# Patient Record
Sex: Male | Born: 1950 | Race: White | Hispanic: No | Marital: Married | Smoking: Current every day smoker
Health system: Southern US, Community
[De-identification: ages and names within clinical notes are randomized; demographics above are authoritative.]

## PROBLEM LIST (undated history)

## (undated) DIAGNOSIS — R001 Bradycardia, unspecified: Secondary | ICD-10-CM

## (undated) DIAGNOSIS — E785 Hyperlipidemia, unspecified: Secondary | ICD-10-CM

## (undated) DIAGNOSIS — Z720 Tobacco use: Secondary | ICD-10-CM

## (undated) DIAGNOSIS — T7819XA Other adverse food reactions, not elsewhere classified, initial encounter: Secondary | ICD-10-CM

## (undated) DIAGNOSIS — C801 Malignant (primary) neoplasm, unspecified: Secondary | ICD-10-CM

## (undated) DIAGNOSIS — R011 Cardiac murmur, unspecified: Secondary | ICD-10-CM

## (undated) DIAGNOSIS — I2510 Atherosclerotic heart disease of native coronary artery without angina pectoris: Secondary | ICD-10-CM

## (undated) DIAGNOSIS — I219 Acute myocardial infarction, unspecified: Secondary | ICD-10-CM

## (undated) DIAGNOSIS — T781XXA Other adverse food reactions, not elsewhere classified, initial encounter: Secondary | ICD-10-CM

## (undated) HISTORY — PX: APPENDECTOMY: SHX54

## (undated) HISTORY — PX: CORONARY STENT PLACEMENT: SHX1402

## (undated) HISTORY — DX: Atherosclerotic heart disease of native coronary artery without angina pectoris: I25.10

## (undated) HISTORY — DX: Hyperlipidemia, unspecified: E78.5

## (undated) HISTORY — DX: Tobacco use: Z72.0

---

## 2011-08-31 DIAGNOSIS — I2510 Atherosclerotic heart disease of native coronary artery without angina pectoris: Secondary | ICD-10-CM | POA: Insufficient documentation

## 2011-08-31 HISTORY — DX: Atherosclerotic heart disease of native coronary artery without angina pectoris: I25.10

## 2011-09-11 ENCOUNTER — Inpatient Hospital Stay (HOSPITAL_COMMUNITY)
Admission: AD | Admit: 2011-09-11 | Discharge: 2011-09-13 | DRG: 853 | Disposition: A | Discharge status: Home / Self Care | Payer: BC Managed Care – PPO | Source: Other Acute Inpatient Hospital | Attending: Internal Medicine | Admitting: Internal Medicine

## 2011-09-11 ENCOUNTER — Encounter (HOSPITAL_COMMUNITY): Payer: Self-pay | Admitting: Physician Assistant

## 2011-09-11 ENCOUNTER — Encounter (HOSPITAL_COMMUNITY)
Admission: AD | Disposition: A | Discharge status: Home / Self Care | Payer: Self-pay | Source: Other Acute Inpatient Hospital | Attending: Internal Medicine

## 2011-09-11 DIAGNOSIS — Z9089 Acquired absence of other organs: Secondary | ICD-10-CM

## 2011-09-11 DIAGNOSIS — F17200 Nicotine dependence, unspecified, uncomplicated: Secondary | ICD-10-CM | POA: Diagnosis present

## 2011-09-11 DIAGNOSIS — E781 Pure hyperglyceridemia: Secondary | ICD-10-CM

## 2011-09-11 DIAGNOSIS — I2510 Atherosclerotic heart disease of native coronary artery without angina pectoris: Secondary | ICD-10-CM

## 2011-09-11 DIAGNOSIS — Z880 Allergy status to penicillin: Secondary | ICD-10-CM

## 2011-09-11 DIAGNOSIS — I214 Non-ST elevation (NSTEMI) myocardial infarction: Secondary | ICD-10-CM

## 2011-09-11 DIAGNOSIS — I498 Other specified cardiac arrhythmias: Secondary | ICD-10-CM | POA: Diagnosis present

## 2011-09-11 DIAGNOSIS — Z8249 Family history of ischemic heart disease and other diseases of the circulatory system: Secondary | ICD-10-CM

## 2011-09-11 DIAGNOSIS — I2589 Other forms of chronic ischemic heart disease: Secondary | ICD-10-CM | POA: Diagnosis present

## 2011-09-11 DIAGNOSIS — Z881 Allergy status to other antibiotic agents status: Secondary | ICD-10-CM

## 2011-09-11 DIAGNOSIS — E785 Hyperlipidemia, unspecified: Secondary | ICD-10-CM

## 2011-09-11 HISTORY — DX: Bradycardia, unspecified: R00.1

## 2011-09-11 HISTORY — PX: LEFT HEART CATHETERIZATION WITH CORONARY ANGIOGRAM: SHX5451

## 2011-09-11 HISTORY — PX: PERCUTANEOUS CORONARY STENT INTERVENTION (PCI-S): SHX5485

## 2011-09-11 HISTORY — DX: Cardiac murmur, unspecified: R01.1

## 2011-09-11 LAB — PROTIME-INR: INR: 1.76 — ABNORMAL HIGH (ref 0.00–1.49)

## 2011-09-11 LAB — HEPATIC FUNCTION PANEL
ALT: 16 U/L (ref 0–53)
AST: 42 U/L — ABNORMAL HIGH (ref 0–37)
Albumin: 3.7 g/dL (ref 3.5–5.2)
Total Protein: 6.7 g/dL (ref 6.0–8.3)

## 2011-09-11 LAB — CARDIAC PANEL(CRET KIN+CKTOT+MB+TROPI)
Relative Index: 9.8 — ABNORMAL HIGH (ref 0.0–2.5)
Troponin I: 8.54 ng/mL (ref <0.30)

## 2011-09-11 SURGERY — PERCUTANEOUS CORONARY STENT INTERVENTION (PCI-S)
Laterality: Right

## 2011-09-11 MED ORDER — ACETAMINOPHEN 325 MG PO TABS: 650.0000 mg | ORAL_TABLET | ORAL | Status: DC | PRN

## 2011-09-11 MED ORDER — MORPHINE SULFATE 2 MG/ML IJ SOLN: 2.0000 mg | INTRAMUSCULAR | Status: DC | PRN

## 2011-09-11 MED ORDER — SODIUM CHLORIDE 0.9 % IJ SOLN: 3.0000 mL | INTRAMUSCULAR | Status: DC | PRN

## 2011-09-11 MED ORDER — MIDAZOLAM HCL 2 MG/2ML IJ SOLN
INTRAMUSCULAR | Status: AC
  Filled 2011-09-11: qty 2

## 2011-09-11 MED ORDER — NITROGLYCERIN 0.4 MG SL SUBL: 0.4000 mg | SUBLINGUAL_TABLET | SUBLINGUAL | Status: DC | PRN

## 2011-09-11 MED ORDER — ONDANSETRON HCL 4 MG/2ML IJ SOLN: 4.0000 mg | Freq: Four times a day (QID) | INTRAMUSCULAR | Status: DC | PRN

## 2011-09-11 MED ORDER — SODIUM CHLORIDE 0.9 % IJ SOLN: 3.0000 mL | Freq: Two times a day (BID) | INTRAMUSCULAR | Status: DC

## 2011-09-11 MED ORDER — ATROPINE SULFATE 1 MG/ML IJ SOLN
INTRAMUSCULAR | Status: AC
  Filled 2011-09-11: qty 1

## 2011-09-11 MED ORDER — BIVALIRUDIN 250 MG IV SOLR
INTRAVENOUS | Status: AC
  Filled 2011-09-11: qty 250

## 2011-09-11 MED ORDER — SODIUM CHLORIDE 0.9 % IV SOLN
1.0000 mL/kg/h | INTRAVENOUS | Status: AC
  Administered 2011-09-11: 1 mL/kg/h via INTRAVENOUS

## 2011-09-11 MED ORDER — SODIUM CHLORIDE 0.9 % IV SOLN: 250.0000 mL | INTRAVENOUS | Status: DC

## 2011-09-11 MED ORDER — SODIUM CHLORIDE 0.9 % IV SOLN: INTRAVENOUS | Status: DC

## 2011-09-11 MED ORDER — HEPARIN (PORCINE) IN NACL 2-0.9 UNIT/ML-% IJ SOLN
INTRAMUSCULAR | Status: AC
  Filled 2011-09-11: qty 2000

## 2011-09-11 MED ORDER — HEPARIN SODIUM (PORCINE) 1000 UNIT/ML IJ SOLN
INTRAMUSCULAR | Status: AC
  Filled 2011-09-11: qty 1

## 2011-09-11 MED ORDER — FENTANYL CITRATE 0.05 MG/ML IJ SOLN
INTRAMUSCULAR | Status: AC
  Filled 2011-09-11: qty 2

## 2011-09-11 MED ORDER — HEPARIN BOLUS VIA INFUSION
4000.0000 [IU] | Freq: Once | INTRAVENOUS | Status: AC
  Administered 2011-09-11: 4000 [IU] via INTRAVENOUS
  Filled 2011-09-11: qty 4000

## 2011-09-11 MED ORDER — ATORVASTATIN CALCIUM 80 MG PO TABS
80.0000 mg | ORAL_TABLET | Freq: Every day | ORAL | Status: DC
  Filled 2011-09-11 (×3): qty 1

## 2011-09-11 MED ORDER — ASPIRIN EC 81 MG PO TBEC
81.0000 mg | DELAYED_RELEASE_TABLET | Freq: Every day | ORAL | Status: DC
  Administered 2011-09-12 – 2011-09-13 (×2): 81 mg via ORAL
  Filled 2011-09-11 (×2): qty 1

## 2011-09-11 MED ORDER — PRASUGREL HCL 10 MG PO TABS
10.0000 mg | ORAL_TABLET | Freq: Every day | ORAL | Status: DC
  Administered 2011-09-12 – 2011-09-13 (×2): 10 mg via ORAL
  Filled 2011-09-11 (×2): qty 1

## 2011-09-11 MED ORDER — VERAPAMIL HCL 2.5 MG/ML IV SOLN
INTRAVENOUS | Status: AC
  Filled 2011-09-11: qty 2

## 2011-09-11 MED ORDER — HEPARIN (PORCINE) IN NACL 100-0.45 UNIT/ML-% IJ SOLN
1300.0000 [IU]/h | INTRAMUSCULAR | Status: DC
  Administered 2011-09-11: 1300 [IU]/h via INTRAVENOUS
  Filled 2011-09-11: qty 250

## 2011-09-11 MED ORDER — NITROGLYCERIN 0.2 MG/ML ON CALL CATH LAB
INTRAVENOUS | Status: AC
  Filled 2011-09-11: qty 1

## 2011-09-11 MED ORDER — OXYCODONE-ACETAMINOPHEN 5-325 MG PO TABS
1.0000 | ORAL_TABLET | ORAL | Status: DC | PRN
  Administered 2011-09-12: 1 via ORAL
  Filled 2011-09-11: qty 1

## 2011-09-11 MED ORDER — LIDOCAINE HCL (PF) 1 % IJ SOLN
INTRAMUSCULAR | Status: AC
  Filled 2011-09-11: qty 30

## 2011-09-11 NOTE — H&P (Signed)
History and Physical  Patient ID: Sean Gomez MRN: [id number], DOB: January 07, 1951 Date of Encounter: 09/11/2011, 7:15 PM Primary Physician: Mariella Saa., MD Primary Cardiologist: New to [place] (lives in [place], went to [place])  Chief Complaint: chest pain (with positive enzymes)  HPI: 61 y/o M with self-reported hx of borderline low blood pressures/HR, prior heart murmur but no hx of CAD presented to [facility] with complaints of chest pain. For the past 2 days he has noted intermittent substernal chest discomfort that comes and goes spontaneously without any aggravating factor. Initially he attributed it to eating food but in retrospect he is not so sure. He felt as though if he could only belch, it would feel better. Last night it kept him from sleep but then eased off. This morning it recurred around 9am and became progressively worse associated with some nausea. He tired Gas X and Gaviscon with no relief. In the ER at [facility] he received 2 SL NTG - the first "knocked it out almost completely;" the 2nd NTG made him pain free. Troponin returned elevated at 0.16 (ref range <0.03). He was also given 4 baby ASA. The pain recurred upon arriving to [facility] and he received a SL NTG by [facility] but still has low-grade 1-2/10 chest discomfort. Blood pressures are borderline in the 90s-100s here but the patient states this is chronic for him. He is bradycardic in the 40's. He denies any recent dizziness/palpitations/syncope but does state he has had occasions in the past where he became transiently woozy for a brief moment, but has never passed out. This has not happened recently. EKG at [facility] showed SB with ICRBB, LAFB with biphasic T waves avL, V2. Here at [facility], EKG shows evolving TW changes in III, somewhat acute T waves in V2.  Past Medical History  Diagnosis Date  . Low blood pressure   . Bradycardia   . Heart murmur   . High cholesterol      Most Recent Cardiac  Studies: None   Surgical History:  Past Surgical History  Procedure Date  . Appendectomy      Home Meds: Prior to Admission medications   Not on File    Allergies:  Allergies  Allergen Reactions  . Penicillins     History   Social History  . Marital Status: Single    Spouse Name: N/A    Number of Children: N/A  . Years of Education: N/A   Occupational History  . consultant [employer]   Social History Main Topics  . Smoking status: Current Everyday Smoker  . Smokeless tobacco: Not on file   Comment: Stopped then restarted, total 46 years - currently 2 ppd  . Alcohol Use: No  . Drug Use: No  . Sexually Active: Not on file   Other Topics Concern  . Not on file   Social History Narrative  . No narrative on file     Family History  Problem Relation Age of Onset  . Aortic aneurysm Mother   . Stroke Father   . Coronary artery disease Neg Hx     Review of Systems: General: negative for chills, fever, night sweats. He has lost 25 lbs over 2 years but states he is has been eating less than usual since he retired (but recently went back to work) Cardiovascular: see above Dermatological: negative for rash Respiratory: negative for cough or wheezing Urologic: negative for hematuria Abdominal: negative for nausea, vomiting, diarrhea, melena, or hematemesis. He has had  a remote hx of spotty blood on TP but no recent GI abnormalities; this has never been sustained. Neurologic: negative for visual changes, syncope. Rare occ "woozy" episode, no LOC All other systems reviewed and are otherwise negative except as noted above.  Labs: from [facility] 09/11/11 1. BMET: Na 139, K 3.8, Cl 106, CO2 25, BUN 18, Cr 0.88, Glu 121 2. WBC 8, Hgb 14.6, Hct 42.5, Plt 199 3. 13:37: CK 90, MB 3.7, trop 0.16 (ref range <0.03) 4. D-dimer neg: 0.26 ( <0.49 WNL) 5. BNP WNL at 63  Radiology/Studies:  CXR 09/11/11 - Normal heart size, no acute abnormalities   EKG: 09/11/11 - sinus  bradycardia 44bpm - ICRBB, LAFB. RSR V1. Biphasic TWI avL, V2, poor R wave progression Here at [facility], EKG shows sinus bradycardia evolving TW changes in III, somewhat acute T waves in V2.  Physical Exam: BP 108/70, RR 16, POX 99% 2L, HR 50 General: Well developed pale WM in no acute distress Head: Normocephalic, atraumatic, sclera non-icteric, no xanthomas, nares are without discharge.  Neck: Negative for carotid bruits. JVD not elevated. Lungs: Clear bilaterally to auscultation without wheezes, rales, or rhonchi. Breathing is unlabored. Heart: Markedly bradycardic with S1 S2. No murmurs, rubs, or gallops appreciated. Abdomen: Soft, non-tender, non-distended with normoactive bowel sounds. No hepatomegaly. No rebound/guarding. No obvious abdominal masses. Msk:  Strength and tone appear normal for age. Extremities: No clubbing or cyanosis. No edema.  Distal pedal pulses are 1+ and equal bilaterally. Neuro: Alert and oriented X 3. Moves all extremities spontaneously. Psych:  Responds to questions appropriately with a normal affect.    ASSESSMENT AND PLAN:   1. NSTEMI 2. Bradycardia 3. Tobacco abuse 4. H/o high cholesterol/hypertriglyceridemia 5. Elevated CBG  Mr. Budney's EKG, bradycardia and ongoing chest pain are very worrisome for evolving MI. We have activated the cath lab team and made Dr. Excell Seltzer aware - we feel he requires urgent catheterization. In the meantime, will start IV heparin. BP are soft so avoid IV NTG, BB for now. Check lipid panel, add statin. Check A1C. Further recommendations will be based on cath lab findings.   Signed, Ronie Spies PA-C 09/11/2011, 7:15 PM  Agree with assessment and plan as noted above.  Patient has had stuttering pattern of left sided chest discomfort for past two days, worse today, partially relieved by SL NTG. Has also noted aching discomfort in left arm. Troponin at [facility] slightly elevated 0.16 (nl <0.03).  Patient is bradycardic in the 40s  now, and BP is low.  EKG done on arrival here shows new T wave inversion in III.  Exam reveals mild pallor, not diaphoretic. Mildly anxious. Chest clear to P and A.  Heart tones are soft without murmur gallop or rub. Impression: NSTEMI with ongoing chest discomfort, bradycardia, hypotension.  Suspect possible significant RCA lesion.  Risks and benefits of urgent cath discussed with patient who is agreeable to proceeding to cath lab tonight.

## 2011-09-11 NOTE — Progress Notes (Signed)
Chaplain responded to a Code STEMI. Chaplain not needed at this time. Cath lab team will page chaplain if needed.

## 2011-09-11 NOTE — CV Procedure (Signed)
Cardiac Catheterization Procedure Note  Name: Sean Gomez MRN: [id number] DOB: 1950-10-06  Procedure: Left Heart Cath, Selective Coronary Angiography, LV angiography,  PTCA/Stent of the RCA, Angioseal of the RFA  Indication: 61 year old gentleman who presented with non-STEMI, ongoing chest pain, and hypotension and bradycardia. He was brought urgently for cardiac catheterization.   Diagnostic Procedure Details: The right wrist was prepped draped and anesthetized with 1% lidocaine. Using the modified Seldinger technique, a 6 French sheath was placed in the right radial artery. 3000 units of unfractionated heparin was administered. 3 mg of verapamil as a minister through the sheath. The wire passed easily but the catheter would not pass easily beyond the elbow. The wire and catheter were moved and an angiogram through the radial artery sheath was performed. This demonstrated a steep radial loop. I attempted to wire the loop with a cougar wire but it would not pass into the radial artery. At that point, I felt it was safest to abort the radial approach. The sheath was flushed and attention was turned to the right groin. The groin was prepped, draped, and anesthetized with 1% lidocaine. Using the modified Seldinger technique, a 6 French sheath was introduced into the right femoral artery. Standard Judkins catheters were used for selective coronary angiography and left ventriculography. Catheter exchanges were performed over a wire.  The diagnostic procedure was well-tolerated without immediate complications.  PROCEDURAL FINDINGS Hemodynamics: AO 100/60 LV 99/22  Coronary angiography: Coronary dominance: right  Left mainstem: The left main is very long. There are minor irregularities but no significant stenoses throughout the left mainstem.  Left anterior descending (LAD): The LAD is patent. There is a large first septal perforator branch. The LAD branches into twin vessels in the midportion  and both vessels reach the distal anterior wall but they do not wrap the LV apex. The diagonal branches are patent.  Left circumflex (LCx): The left circumflex gives off 3 obtuse marginal branches, all of which are patent.  Right coronary artery (RCA): The RCA is totally occluded in the midportion and there is TIMI 1 flow beyond the occlusion. There is heavy thrombus burden. The vessel gives off a large PDA and a large posterolateral branch.  Left ventriculography: There is diffuse hypokinesis of the inferior wall. The anterolateral wall and apex appear to contract normally. The estimated left ventricular ejection fraction is 45%. There is no significant mitral regurgitation visualized.  PCI Procedure Note:  Following the diagnostic procedure, the decision was made to proceed with PCI. The patient was loaded on the table with effient 60 mg. Prior to drug administration I confirmed that he has no history of stroke or TIA and he's never had a bleeding problem. Weight-based bivalirudin was given for anticoagulation. Once a therapeutic ACT was achieved, a 6 [place] JR 4 guide catheter was inserted.  A cougar coronary guidewire was initially attempted to cross the lesion but would not cross. I changed out to a whisper wire.  Because of the heavy thrombus burden, and aspiration catheter was utilized. This restored TIMI-3 flow.  The lesion was then stented with a 3.5 x 28 mm Xience drug-eluting stent.  The stent was postdilated with a 3.75 x 20 mm noncompliant balloon to 16 atmospheres.  Following PCI, there was 0% residual stenosis, the distal vessel filled with TIMI-3 flow, but contrast clearing was a little slow. Final angiography confirmed an excellent result. Femoral hemostasis was achieved with an Angio-Seal device.  The radial band was placed for hemostasis of the radial  sheath. The patient tolerated the PCI procedure well. There were no immediate procedural complications.  The patient was transferred to the  post catheterization recovery area for further monitoring.  PCI Data: Vessel - RCA/Segment - mid Percent Stenosis (pre)  100 TIMI-flow 1 Stent 3.5 x 28 mm drug-eluting Percent Stenosis (post) 0 TIMI-flow (post) 3  Final Conclusions:   1. 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 Minimal disease of the left main, LAD, and left circumflex 3. Mild to moderate segmental left ventricular systolic dysfunction  Recommendations: Dual antiplatelet therapy with aspirin and effient for 12 months minimum. Aggressive risk reduction to include tobacco cessation. Post myocardial infarction care in the CCU. If the patient has an uncomplicated course he could be considered for discharge in 48 hours.  Tonny Bollman 09/11/2011, 9:20 PM

## 2011-09-11 NOTE — Interval H&P Note (Signed)
History and Physical Interval Note:  09/11/2011 8:01 PM  Sean Gomez  has presented today for surgery, with the diagnosis of chest pain  The various methods of treatment have been discussed with the patient and family. After consideration of risks, benefits and other options for treatment, the patient has consented to  Procedure(s) (LRB): LEFT HEART CATHETERIZATION WITH CORONARY ANGIOGRAM (N/A) as a surgical intervention .  The patient's history has been reviewed, patient examined, no change in status, stable for surgery.  I have reviewed the patients' chart and labs.  Questions were answered to the patient's satisfaction.    Patient independently interviewed and evaluated. He has been unstable with bradycardia and hypotension and we're proceeding urgently with cardiac catheterization. He is having minimal chest pain at present. His Allen's test on the right side is positive and I will plan a right radial approach. I have reviewed the risks of urgent cardiac catheterization and PCI in detail with the patient. These risks include but are not limited to arterial injury, bleeding, stroke, myocardial infarction, and death. The patient understands and agrees to proceed.  Tonny Bollman  09/11/2011 8:01 PM

## 2011-09-11 NOTE — Progress Notes (Signed)
ANTICOAGULATION CONSULT NOTE - Initial Consult  Pharmacy Consult for Heparin Indication: chest pain/ACS  Allergies  Allergen Reactions  . Penicillins     Patient Measurements:  Weight: 102.5kg Heparin Dosing Weight: 96.6  Vital Signs: Temp: 98.1 F (36.7 C) (07/12 1844) Temp src: Oral (07/12 1844) BP: 104/58 mmHg (07/12 1844) Pulse Rate: 41  (07/12 1844)  Labs: No results found for this basename: HGB:2,HCT:3,PLT:3,APTT:3,LABPROT:3,INR:3,HEPARINUNFRC:3,CREATININE:3,CKTOTAL:3,CKMB:3,TROPONINI:3 in the last 72 hours  CrCl is unknown because no creatinine reading has been taken and the patient has no height on file.   Medical History: Past Medical History  Diagnosis Date  . Low blood pressure   . Bradycardia   . Heart murmur   . High cholesterol     Medications:  No prescriptions prior to admission    Assessment: 61yom transferred from [facility] to start heparin for CP/STEMI. Patient reports no bleeding and not taking any anticoagulants. - Labs from [facility]: Hg 14.6, Plts 199, SCr 0.88 - INR has been ordered  Goal of Therapy:  Heparin level 0.3-0.7 units/ml Monitor platelets by anticoagulation protocol: Yes   Plan:  1. Heparin IV bolus 4000 units x 1 2. Heparin drip 1300 units/hr (13 ml/hr) 3. Check heparin level 6 hours after heparin initiation or follow-up post cath 4. Daily heparin level and CBC    Cleon Dew [phone] 09/11/2011,7:15 PM

## 2011-09-12 DIAGNOSIS — E781 Pure hyperglyceridemia: Secondary | ICD-10-CM

## 2011-09-12 DIAGNOSIS — E785 Hyperlipidemia, unspecified: Secondary | ICD-10-CM

## 2011-09-12 LAB — CARDIAC PANEL(CRET KIN+CKTOT+MB+TROPI)
CK, MB: 97.1 ng/mL (ref 0.3–4.0)
CK, MB: 95.5 ng/mL (ref 0.3–4.0)
Total CK: 1173 U/L — ABNORMAL HIGH (ref 7–232)
Troponin I: >20 ng/mL (ref <0.30)

## 2011-09-12 LAB — BASIC METABOLIC PANEL
CO2: 25 mEq/L (ref 19–32)
Calcium: 8.7 mg/dL (ref 8.4–10.5)
Chloride: 110 mEq/L (ref 96–112)
Glucose, Bld: 105 mg/dL — ABNORMAL HIGH (ref 70–99)
Sodium: 143 mEq/L (ref 135–145)

## 2011-09-12 LAB — LIPID PANEL
HDL: 21 mg/dL — ABNORMAL LOW (ref >39)
LDL Cholesterol: UNDETERMINED mg/dL (ref 0–99)

## 2011-09-12 LAB — HEMOGLOBIN A1C
Hgb A1c MFr Bld: 5.8 % — ABNORMAL HIGH (ref <5.7)
Mean Plasma Glucose: 120 mg/dL — ABNORMAL HIGH (ref <117)

## 2011-09-12 LAB — CBC
HCT: 39.2 % (ref 39.0–52.0)
Hemoglobin: 13.2 g/dL (ref 13.0–17.0)
MCV: 90.7 fL (ref 78.0–100.0)
RBC: 4.32 MIL/uL (ref 4.22–5.81)
WBC: 8 10*3/uL (ref 4.0–10.5)

## 2011-09-12 MED ORDER — ALPRAZOLAM 0.5 MG PO TABS: 0.5000 mg | ORAL_TABLET | Freq: Three times a day (TID) | ORAL | Status: DC | PRN

## 2011-09-12 NOTE — Progress Notes (Signed)
PROGRESS NOTE  Subjective:   Sean Gomez is a 61 yo admitted 7/12 with an Inferior wall STEMI.  Successful PCI to RCA.  Has slight residual pain this am.  Feeling  Much better   Objective:    Vital Signs:   Temp:  [98.1 F (36.7 C)-98.5 F (36.9 C)] 98.4 F (36.9 C) (07/13 0800) Pulse Rate:  [41-58] 56  (07/13 0800) Resp:  [12-22] 12  (07/13 0800) BP: (83-126)/(45-72) 104/56 mmHg (07/13 0800) SpO2:  [92 %-100 %] 95 % (07/13 0800) FiO2 (%):  [28 %] 28 % (07/12 1916) Weight:  [217 lb 2.5 oz (98.5 kg)-226 lb (102.513 kg)] 217 lb 2.5 oz (98.5 kg) (07/12 2145)  Last BM Date: 09/11/11   24-hour weight change: Weight change:   Weight trends: Filed Weights   09/11/11 1900 09/11/11 2145  Weight: 226 lb (102.513 kg) 217 lb 2.5 oz (98.5 kg)    Intake/Output:  07/12 0701 - 07/13 0700 In: 800 [I.V.:800] Out: 700 [Urine:700] Total I/O In: 480 [P.O.:480] Out: -    Physical Exam: BP 104/56  Pulse 56  Temp 98.4 F (36.9 C) (Oral)  Resp 12  Ht 5\' 11"  (1.803 m)  Wt 217 lb 2.5 oz (98.5 kg)  BMI 30.29 kg/m2  SpO2 95%  General: Vital signs reviewed and noted. Well-developed, well-nourished, in no acute distress; alert, appropriate and cooperative .  Head: Normocephalic, atraumatic.  Eyes: conjunctivae/corneas clear.  EOM's intact.   Throat: normal  Neck: Supple. Normal carotids. No JVD  Lungs:  Clear to auscultation. Defib pads removed  Heart: Regular rate,  With normal  S1 S2. No murmurs, gallops or rubs  Abdomen:  Soft, non-tender, non-distended with normoactive bowel sounds. No hepatomegaly. No rebound/guarding. No abdominal masses.  Extremities: Distal pedal pulses are 2+ .  No edema.  Right wrist and right groin are ok.  Neurologic: A&O X3, CN II - XII are grossly intact. Motor strength is 5/5 in the all 4 extremities.  Psych: Responds to questions appropriately with normal affect.    Labs: BMET:  Basename 09/12/11 0500  NA 143  K 4.1  CL 110  CO2 25  GLUCOSE  105*  BUN 13  CREATININE 0.89  CALCIUM 8.7  MG --  PHOS --    Liver function tests:  [facility] 09/11/11 2158  AST 42*  ALT 16  ALKPHOS 75  BILITOT 0.3  PROT 6.7  ALBUMIN 3.7   No results found for this basename: LIPASE:2,AMYLASE:2 in the last 72 hours  CBC:  Basename 09/12/11 0525  WBC 8.0  NEUTROABS --  HGB 13.2  HCT 39.2  MCV 90.7  PLT 168    Cardiac Enzymes:  Basename 09/12/11 0525 09/11/11 2158  CKTOTAL 1173* 566*  CKMB 97.1* 55.5*  TROPONINI >20.00* 8.54*    Coagulation Studies:  Basename 09/11/11 2158  LABPROT 20.8*  INR 1.76*    Other: No components found with this basename: POCBNP:3 No results found for this basename: DDIMER in the last 72 hours  Basename 09/11/11 2158  HGBA1C 5.8*    Basename 09/12/11 0500  CHOL 204*  HDL 21*  LDLCALC UNABLE TO CALCULATE IF TRIGLYCERIDE OVER 400 mg/dL  TRIG 454*  CHOLHDL 9.7    Basename 09/11/11 2158  TSH 1.349  T4TOTAL --  T3FREE --  THYROIDAB --   No results found for this basename: VITAMINB12,FOLATE,FERRITIN,TIBC,IRON,RETICCTPCT in the last 72 hours   Tele:  NSR  Medications:    Infusions:    . sodium chloride 1  mL/kg/hr (09/11/11 2226)  . DISCONTD: sodium chloride    . DISCONTD: sodium chloride    . DISCONTD: heparin 1,300 Units/hr (09/11/11 1924)    Scheduled Medications:    . aspirin EC  81 mg Oral Daily  . atorvastatin  80 mg Oral q1800  . atropine      . bivalirudin      . fentaNYL      . heparin      . heparin      . heparin  4,000 Units Intravenous Once  . lidocaine      . midazolam      . nitroGLYCERIN      . prasugrel  10 mg Oral Daily  . verapamil      . DISCONTD: sodium chloride  3 mL Intravenous Q12H    Assessment/ Plan:    1. CAD:  S/p STEMI. S/p successful PCI of mid RCA. Enzymes are elevated as expected. Patient is doing well.  Cont. ASA, Effient Had a 15 beat run of NSVT. Likely due to reperfusion.  His HR is 40-50 . I don't think we can add beta  blocker yet.  BP is only 103 / 56.  Will wait on ACE inhibitor as well.  2. Hyperlipidemia.  Trigs = 465. Total cholesterol = 204.  HDL = 21.  Will continue atorvastatin 80.  Disposition: DC to home tomorrow if he remains stable. Length of Stay: 1  Vesta Mixer, Montez Hageman., MD, [facility] 09/12/2011, 8:37 AM Office [phone] Pager [phone]

## 2011-09-12 NOTE — Progress Notes (Signed)
CRITICAL VALUE ALERT  Critical value received:  Troponin >20, ck,mb =97.1, relative index=8.3  Date of notification:  09/12/2011  Time of notification:  0630  Critical value read back:yes  Nurse who received alert:  Anselm Lis  MD notified (1st page):  Duke Fellow  Time of first page:  0630  MD notified (2nd page):duke fellow  Time of second OZHY:[id number]  Responding MD:  none  Time MD responded:  none

## 2011-09-12 NOTE — Progress Notes (Signed)
CARDIAC REHAB PHASE I   PRE:  Rate/Rhythm: 57SB  BP:  Supine: 97/49  Sitting:   Standing:    SaO2:   MODE:  Ambulation: 250 ft   POST:  Rate/Rhythem: 72SR  BP:  Supine:   Sitting: 98/58  Standing:    SaO2:  1015-1120 Noted in progress note that pt might be d/ced tomorrow so I took pt on short walk. Walked 250 ft with steady gait. No CP. To recliner with call bell after walk. Education given. Discussed MI restrictions. Pt shocked that he could not drive and return to work Monday. Stressed with pt importance of not over-doing. Pt stated he thought he got stent and was fixed now. Discussed smoking cessation. Pt stated he has quit before. Discussed diet and lipid panel. Pt adamant that he does not want to take statin. Stated he had muscle pains with lipitor. Told pt to discuss with MD as there are other meds to choose. Declined referral to Phase 2 at this time. Left brochure. Pt will need reenforcement from cardiology not to overdo and importance of letting heart heal.  Duanne Limerick

## 2011-09-13 ENCOUNTER — Encounter (HOSPITAL_COMMUNITY): Payer: Self-pay | Admitting: Physician Assistant

## 2011-09-13 MED ORDER — NITROGLYCERIN 0.4 MG SL SUBL: 0.4000 mg | SUBLINGUAL_TABLET | SUBLINGUAL | Status: DC | PRN

## 2011-09-13 MED ORDER — ASPIRIN 81 MG PO TBEC: 81.0000 mg | DELAYED_RELEASE_TABLET | Freq: Every day | ORAL | Status: AC

## 2011-09-13 MED ORDER — ATORVASTATIN CALCIUM 80 MG PO TABS: 80.0000 mg | ORAL_TABLET | Freq: Every day | ORAL | Status: DC

## 2011-09-13 MED ORDER — PRASUGREL HCL 10 MG PO TABS: 10.0000 mg | ORAL_TABLET | Freq: Every day | ORAL | Status: DC

## 2011-09-13 MED ORDER — METOPROLOL TARTRATE 25 MG PO TABS: 25.0000 mg | ORAL_TABLET | Freq: Two times a day (BID) | ORAL | Status: DC

## 2011-09-13 MED ORDER — ACETAMINOPHEN 500 MG PO TABS: ORAL_TABLET | ORAL | Status: DC

## 2011-09-13 MED ORDER — METOPROLOL TARTRATE 25 MG PO TABS
25.0000 mg | ORAL_TABLET | Freq: Two times a day (BID) | ORAL | Status: DC
  Administered 2011-09-13: 25 mg via ORAL
  Filled 2011-09-13: qty 1

## 2011-09-13 NOTE — Progress Notes (Signed)
PROGRESS NOTE  Subjective:   Sean Gomez is a 61 yo admitted 7/12 with an Inferior wall STEMI.  Successful PCI to RCA.  Has slight residual pain this am.  Feeling  Much better.  Ambulated without problems.   Objective:    Vital Signs:   Temp:  [97.1 F (36.2 C)-99 F (37.2 C)] 98 F (36.7 C) (07/14 0800) Resp:  [0-24] 19  (07/14 0800) BP: (97-121)/(43-81) 117/57 mmHg (07/14 0800) SpO2:  [93 %-98 %] 96 % (07/14 0800)  Last BM Date: 09/12/11   24-hour weight change: Weight change:   Weight trends: Filed Weights   09/11/11 1900 09/11/11 2145  Weight: 226 lb (102.513 kg) 217 lb 2.5 oz (98.5 kg)    Intake/Output:  07/13 0701 - 07/14 0700 In: 1320 [P.O.:1320] Out: 1678 [Urine:1677; Stool:1] Total I/O In: 240 [P.O.:240] Out: -    Physical Exam: BP 117/57  Pulse 56  Temp 98 F (36.7 C) (Oral)  Resp 19  Ht 5\' 11"  (1.803 m)  Wt 217 lb 2.5 oz (98.5 kg)  BMI 30.29 kg/m2  SpO2 96%  General: Vital signs reviewed and noted. Well-developed, well-nourished, in no acute distress; alert, appropriate and cooperative .  Head: Normocephalic, atraumatic.  Eyes: conjunctivae/corneas clear.  EOM's intact.   Throat: normal  Neck: Supple. Normal carotids. No JVD  Lungs:  Clear to auscultation. Defib pads removed  Heart: Regular rate,  With normal  S1 S2. No murmurs, gallops or rubs  Abdomen:  Soft, non-tender, non-distended with normoactive bowel sounds. No hepatomegaly. No rebound/guarding. No abdominal masses.  Extremities: Distal pedal pulses are 2+ .  No edema.  Right wrist and right groin are ok.  Neurologic: A&O X3, CN II - XII are grossly intact. Motor strength is 5/5 in the all 4 extremities.  Psych: Responds to questions appropriately with normal affect.    Labs: BMET:  Basename 09/12/11 0500  NA 143  K 4.1  CL 110  CO2 25  GLUCOSE 105*  BUN 13  CREATININE 0.89  CALCIUM 8.7  MG --  PHOS --    Liver function tests:  [facility] 09/11/11 2158  AST 42*  ALT  16  ALKPHOS 75  BILITOT 0.3  PROT 6.7  ALBUMIN 3.7   No results found for this basename: LIPASE:2,AMYLASE:2 in the last 72 hours  CBC:  Basename 09/12/11 0525  WBC 8.0  NEUTROABS --  HGB 13.2  HCT 39.2  MCV 90.7  PLT 168    Cardiac Enzymes:  Basename 09/12/11 0814 09/12/11 0525 09/11/11 2158  CKTOTAL 1218* 1173* 566*  CKMB 95.5* 97.1* 55.5*  TROPONINI >20.00* >20.00* 8.54*    Coagulation Studies:  Basename 09/11/11 2158  LABPROT 20.8*  INR 1.76*    Other: No components found with this basename: POCBNP:3 No results found for this basename: DDIMER in the last 72 hours  Basename 09/11/11 2158  HGBA1C 5.8*    Basename 09/12/11 0500  CHOL 204*  HDL 21*  LDLCALC UNABLE TO CALCULATE IF TRIGLYCERIDE OVER 400 mg/dL  TRIG 098*  CHOLHDL 9.7    Basename 09/11/11 2158  TSH 1.349  T4TOTAL --  T3FREE --  THYROIDAB --   No results found for this basename: VITAMINB12,FOLATE,FERRITIN,TIBC,IRON,RETICCTPCT in the last 72 hours   Tele:  NSR at 71  Medications:    Infusions:    Scheduled Medications:    . aspirin EC  81 mg Oral Daily  . atorvastatin  80 mg Oral q1800  . prasugrel  10 mg Oral  Daily    Assessment/ Plan:    1. CAD:  S/p STEMI. S/p successful PCI of mid RCA. Enzymes are elevated as expected. Patient is doing well.  Cont. ASA, Effient Had a 15 beat run of NSVT. Likely due to reperfusion.  HR is better - 76 today.  BP 117/57.  Will start Metoprolol 25 BID today.  2. Hyperlipidemia.  Trigs = 465. Total cholesterol = 204.  HDL = 21.  Will continue atorvastatin 80.  We had a long talk about low fat , low cholesterol diet and a regular exercise routine.  Disposition: DC to home today. Length of Stay: 2  Vesta Mixer, Montez Hageman., MD, [facility] 09/13/2011, 8:42 AM Office [phone]  Pager [phone]

## 2011-09-13 NOTE — Progress Notes (Signed)
D/c instructions given to pt and family. D/c home.

## 2011-09-13 NOTE — Discharge Summary (Signed)
Discharge Summary   Patient ID: Sean Gomez MRN: [id number], DOB/AGE: 05/02/1950 61 y.o. Admit date: 09/11/2011 D/C date:     09/13/2011    Primary Care Physician:  Mariella Saa., MD  Primary Cardiologist:  New - will arrange follow up at [facility] office (patient lives in [place]). Primary Electrophysiologist:  None   Reason for Admission:  NSTEMI  Primary Discharge Diagnoses:  1.  NSTEMI   -  S/p PCI of RCA with Xience DES this admission 2.  Coronary Artery Disease 3.  Ischemic Cardiomyopathy 4.  Hyperlipidemia 5.  Tobacco Abuse  Secondary Discharge Diagnoses:   Past Medical History  Diagnosis Date  . Bradycardia   . Heart murmur   . HLD (hyperlipidemia)   . CAD (coronary artery disease)     NSTEMI 7/13: LHC with mRCA occluded tx with Xience DES to mRCA; minimal disease in CFX/LAD  . Ischemic cardiomyopathy     EF 45% with inf HK at [facility] 7/13     Procedures Performed This Admission:    Coronary Angiography 09/11/11:  Coronary dominance: right  LM: minor irregularities but no significant stenoses throughout the left mainstem.  LAD: The LAD is patent.  LCx:  The left circumflex gives off 3 obtuse marginal branches, all of which are patent.  RCA: The RCA is totally occluded in the midportion and there is TIMI 1 flow beyond the occlusion. There is heavy thrombus burden. The vessel gives off a large PDA and a large posterolateral branch.  Left ventriculography: Diffuse HK of the inferior wall. EF 45%. There is no significant mitral regurgitation visualized.  PCI Data: Vessel - RCA/Segment - mid with Xience 3.5 x 28 mm drug-eluting  Recommendations: Dual antiplatelet therapy with aspirin and effient for 12 months minimum. Aggressive risk reduction to include tobacco cessation.   Hospital Course: Sean Gomez is a 61 y.o. male with a h/o low BP, HL and heart murmur presented to [facility] on the day of admission with worsening chest pain that had  begun 2 days prior.  Initial TnI was minimally elevated at 0.16 and he was tx to [facility].  He still had low level CP upon presentation with borderline BPs (90-100) and HRs in the 40s.  ECG:  Sinus brady, inc RBBB, LAFB, biphasic TW aVL, V2 => evolving TW changes in III, s/w acute TW in V2.  He was admitted for NSTEMI and taken directly to the cath lab due to ongoing CP.  LHC demonstrated an occluded mid RCA which was treated with PCI with a Xience DES.  EF was depressed at 45% with inf wall HK.  He had some NSVT post procedure felt to be related to reperfusion.  Remained bradycardic with HR 40-50.  High dose atorvastatin added (trigs 465, TC 204, HDL 21).  HR improved into the 70s on day of d/c and low dose metoprolol added.  He was seen by Dr. Delane Ginger and felt ready for d/c to home today.  Patient lives in [place] and our [place] office is closest for him.  Will arrange follow up there.  His job is data entry and low stress.  He can return to work in one week.    Discharge Vitals: Blood pressure 117/57, pulse 56, temperature 98 F (36.7 C), temperature source Oral, resp. rate 19, height 5\' 11"  (1.803 m), weight 217 lb 2.5 oz (98.5 kg), SpO2 96.00%.  Labs: Lab Results  Component Value Date   WBC 8.0 09/12/2011  HGB 13.2 09/12/2011   HCT 39.2 09/12/2011   MCV 90.7 09/12/2011   PLT 168 09/12/2011     Lab 09/12/11 0500 09/11/11 2158  NA 143 --  K 4.1 --  CL 110 --  CO2 25 --  BUN 13 --  CREATININE 0.89 --  CALCIUM 8.7 --  PROT -- 6.7  BILITOT -- 0.3  ALKPHOS -- 75  ALT -- 16  AST -- 42*  GLUCOSE 105* --    Basename 09/12/11 0814 09/12/11 0525 09/11/11 2158  CKTOTAL 1218* 1173* 566*  CKMB 95.5* 97.1* 55.5*  TROPONINI >20.00* >20.00* 8.54*   Lab Results  Component Value Date   CHOL 204* 09/12/2011   HDL 21* 09/12/2011   LDLCALC UNABLE TO CALCULATE IF TRIGLYCERIDE OVER 400 mg/dL 9/60/4540   TRIG 981* 1/91/4782   No results found for this basename: DDIMER   Lab Results   Component Value Date   TSH 1.349 09/11/2011    Diagnostic Studies/Procedures:  No results found.    Discharge Medications   Medication List  As of 09/13/2011  9:54 AM   STOP taking these medications         ADVIL 200 MG tablet         TAKE these medications         acetaminophen 500 MG tablet   Commonly known as: TYLENOL   Take 1-2 every 6 hours as needed for pain, headache, etc.      aspirin 81 MG EC tablet   Take 1 tablet (81 mg total) by mouth daily.      atorvastatin 80 MG tablet   Commonly known as: LIPITOR   Take 1 tablet (80 mg total) by mouth daily at 6 PM.      GAS-X PO   Take 1 capsule by mouth 2 (two) times daily as needed. For gas      GAVISCON EXTRA RELIEF FORMULA PO   Take 1 tablet by mouth daily as needed. For indigestion      metoprolol tartrate 25 MG tablet   Commonly known as: LOPRESSOR   Take 1 tablet (25 mg total) by mouth 2 (two) times daily.      nitroGLYCERIN 0.4 MG SL tablet   Commonly known as: NITROSTAT   Place 1 tablet (0.4 mg total) under the tongue every 5 (five) minutes x 3 doses as needed for chest pain.      prasugrel 10 MG Tabs   Commonly known as: EFFIENT   Take 1 tablet (10 mg total) by mouth daily.            Disposition   The patient will be discharged in stable condition to home. Discharge Orders    Future Orders Please Complete By Expires   Diet - low sodium heart healthy      Increase activity slowly      Driving Restrictions      Comments:   None for 1 week   Lifting restrictions      Comments:   Nothing over 10 lbs for 2 weeks.   Sexual Activity Restrictions      Comments:   None for 2 weeks.   Other Restrictions      Comments:   You may return to work in one week (Monday 09/21/11).   Discharge wound care:      Comments:   Call for any swelling, bleeding, bruising or fever.     Follow-up Information    Follow up with [facility]  in 2 weeks. (office will call to arrange follow up with  one of our doctors in [place] in 2 weeks)    Contact information:   [address] [place] [address]       Follow up with Mariella Saa., MD in 2 weeks. (please call)    Contact information:   [address] [place] [address] [phone]            Duration of Discharge Encounter: Greater than 30 minutes including physician and PA time.  Outstanding Labs/Studies: 1.  Lipids and LFTs in 4-6 weeks.   Luna Glasgow, PA-C  9:54 AM 09/13/2011        [address] [place], [place]  [address] Phone: [phone] Fax:  [phone]   Attending Note:   The patient was seen and examined.  Agree with assessment and plan as noted above.  See my note from earlier today.  Vesta Mixer, Montez Hageman., MD, [facility] 09/13/2011, 10:11 AM

## 2011-09-13 NOTE — Progress Notes (Signed)
Pt discharged--waiting for ride home.

## 2011-09-14 LAB — POCT ACTIVATED CLOTTING TIME: Activated Clotting Time: 454 seconds

## 2011-09-14 MED FILL — Dextrose Inj 5%: INTRAVENOUS | Qty: 50 | Status: AC

## 2011-09-14 NOTE — Progress Notes (Signed)
Utilization Review Completed.Kelvyn Schunk T7/15/2013   

## 2011-09-16 ENCOUNTER — Telehealth: Payer: Self-pay | Admitting: Adult Health

## 2011-09-16 NOTE — Telephone Encounter (Signed)
PT WAS PUT ON ATORVASTAIN WHEN DISCHARGED FROM HOSPITAL, HE IS SCHEDULED FOR APPT WITH [facility] NEXT WEEK.  THIS MEDICATION IS CAUSING PAIN IN ARMS "SORNESS IN ELBOWS"

## 2011-09-16 NOTE — Telephone Encounter (Signed)
**Note De-Identified Lekia Nier Obfuscation** Pt. Advised to hold Lipitor until OV with Harriet Pho, NP on Tuesday and that we will address at that time./LV

## 2011-09-17 NOTE — Telephone Encounter (Signed)
Ok to hold Lipitor. Will address on follow up appointment  Samara Deist

## 2011-09-21 ENCOUNTER — Encounter: Payer: Self-pay | Admitting: Adult Health

## 2011-09-22 ENCOUNTER — Other Ambulatory Visit: Payer: Self-pay | Admitting: Cardiology

## 2011-09-22 ENCOUNTER — Encounter: Payer: Self-pay | Admitting: Adult Health

## 2011-09-22 ENCOUNTER — Ambulatory Visit (INDEPENDENT_AMBULATORY_CARE_PROVIDER_SITE_OTHER): Payer: BC Managed Care – PPO | Admitting: Adult Health

## 2011-09-22 VITALS — BP 101/56 | HR 56 | Ht 71.0 in | Wt 221.1 lb

## 2011-09-22 DIAGNOSIS — I714 Abdominal aortic aneurysm, without rupture: Secondary | ICD-10-CM

## 2011-09-22 DIAGNOSIS — I214 Non-ST elevation (NSTEMI) myocardial infarction: Secondary | ICD-10-CM

## 2011-09-22 DIAGNOSIS — IMO0001 Reserved for inherently not codable concepts without codable children: Secondary | ICD-10-CM

## 2011-09-22 DIAGNOSIS — E7800 Pure hypercholesterolemia, unspecified: Secondary | ICD-10-CM

## 2011-09-22 DIAGNOSIS — Z8249 Family history of ischemic heart disease and other diseases of the circulatory system: Secondary | ICD-10-CM

## 2011-09-22 DIAGNOSIS — E785 Hyperlipidemia, unspecified: Secondary | ICD-10-CM

## 2011-09-22 DIAGNOSIS — I2510 Atherosclerotic heart disease of native coronary artery without angina pectoris: Secondary | ICD-10-CM

## 2011-09-22 MED ORDER — EZETIMIBE 10 MG PO TABS: 10.0000 mg | ORAL_TABLET | Freq: Every day | ORAL | Status: DC

## 2011-09-22 NOTE — Assessment & Plan Note (Signed)
He is doing well and is without complaint. It is noted that he is not on an ACE inhibitor at this time. He is hypotensive. May need to adjust BB to 25 mg daily instead of 50 mg daily with hypotension and bradycardia. However, will wait until 3 month follow up to do this after repeat echocardiogram is completed.   A copy of his cardiac cath report is provided. Diagram map of stent card is also documented to show him where his stent is placed. He is advised on low sodium diet with low EF. Explanation given concerning ischemic CM and need to control BP and HR.  He verbalizes understanding.

## 2011-09-22 NOTE — Assessment & Plan Note (Addendum)
He is unable to tolerate statins due to myalgia's. I have given him samples of Zetia to try and advised him to start fish oil and naisin. He is advised on low cholesterol diet. If he is tolerant of Zetia, will Rx him monthly doses. He will have recheck in 3 months. He plans to start going to the gym and walking in the next week.

## 2011-09-22 NOTE — Progress Notes (Signed)
   HPI: Mr. Sean Gomez is a friendly 61 y/o patient here to be established in our clinic after admission for NSTEMI requiring DES to the mid RCA due to total occlusion. Other corononary arteries were patent. EF 45%. He was placed on DAPT with Effient and ASA. He Has not tolerated Lipitor secondary to myalgias. He has stopped smoking. 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      Allergies  Allergen Reactions  . Penicillins   . Statins     myalgia    Current Outpatient Prescriptions  Medication Sig Dispense Refill  . acetaminophen (TYLENOL) 500 MG tablet Take 1-2 every 6 hours as needed for pain, headache, etc.      . aspirin EC 81 MG EC tablet Take 1 tablet (81 mg total) by mouth daily.      . metoprolol tartrate (LOPRESSOR) 25 MG tablet Take 1 tablet (25 mg total) by mouth 2 (two) times daily.  60 tablet  5  . nitroGLYCERIN (NITROSTAT) 0.4 MG SL tablet Place 1 tablet (0.4 mg total) under the tongue every 5 (five) minutes x 3 doses as needed for chest pain.  25 tablet  11  . prasugrel (EFFIENT) 10 MG TABS Take 1 tablet (10 mg total) by mouth daily.  30 tablet  11  . Simethicone (GAS-X PO) Take 1 capsule by mouth 2 (two) times daily as needed. For gas      . ezetimibe (ZETIA) 10 MG tablet Take 1 tablet (10 mg total) by mouth daily.  90 tablet  3    Past Medical History  Diagnosis Date  . Bradycardia   . Heart murmur   . HLD (hyperlipidemia)   . CAD (coronary artery disease)     NSTEMI 7/13: LHC with mRCA occluded tx with Xience DES to mRCA; minimal disease in CFX/LAD  . Ischemic cardiomyopathy     EF 45% with inf HK at [facility] 7/13    Past Surgical History  Procedure Date  . Appendectomy     ZOX:WRUEAV of systems complete and found to be negative unless listed above  PHYSICAL EXAM BP 101/56  Pulse 56  Ht 5\' 11"  (1.803 m)  Wt 221 lb 1.9 oz (100.299 kg)  BMI 30.84 kg/m2  General: Well developed, well  nourished, in no acute distress Head: Eyes PERRLA, No xanthomas.   Normal cephalic and atramatic  Lungs: Clear bilaterally to auscultation and percussion. Heart: HRRR S1 S2, bradycardic without MRG.  Pulses are 2+ & equal.            No carotid bruit. No JVD.  No abdominal bruits. No femoral bruits. Abdomen: Bowel sounds are positive, abdomen soft and non-tender without masses or                  Hernia's noted. Msk:  Back normal, normal gait. Normal strength and tone for age. Extremities: No clubbing, cyanosis or edema.  DP +1.right groin site and right wrist site of catheter insertion is healthy without evidence of bleeding or hematoma. Neuro: Alert and oriented X 3. Psych:  Good affect, responds appropriately  EKG: Sinus bradycardia rate of 56 bpm.  ASSESSMENT AND PLAN

## 2011-09-22 NOTE — Assessment & Plan Note (Signed)
Will have abdominal ultrasound completed with follow up echo in 3 months.

## 2011-09-22 NOTE — Patient Instructions (Addendum)
LABS:  BMET this week at the [facility].  FUTURE LABS:  IN 3 months you will need a fasting Lipid Panel and Liver Panel at the [facility].  You will receive and order via mail.  Your physician has requested that you have an echocardiogram in 3 months. Echocardiography is a painless test that uses sound waves to create images of your heart. It provides your doctor with information about the size and shape of your heart and how well your heart's chambers and valves are working. This procedure takes approximately one hour. There are no restrictions for this procedure.  Your physician has requested that you have an abdominal aorta duplex in 3 months. During this test, an ultrasound is used to evaluate the aorta. Allow 30 minutes for this exam. Do not eat after midnight the day before and avoid carbonated beverages  Try Zetia Samples, 10mg  every evening.    Your physician recommends that you schedule a follow-up appointment in: 3 months after completing the above tests.

## 2011-09-26 LAB — BASIC METABOLIC PANEL
CO2: 26 mEq/L (ref 19–32)
Chloride: 105 mEq/L (ref 96–112)
Potassium: 4.3 mEq/L (ref 3.5–5.3)
Sodium: 138 mEq/L (ref 135–145)

## 2011-09-29 ENCOUNTER — Telehealth: Payer: Self-pay | Admitting: Cardiology

## 2011-09-29 NOTE — Telephone Encounter (Signed)
PT WAS LIGHT HEADED THIS AM AT THAT TIME BP WAS 120/76. HE HAD A COOKIE AND IS FEELING BETTER. BUT THOUGHT HE SHOULD LET us KNOW.

## 2011-09-29 NOTE — Telephone Encounter (Signed)
noted 

## 2011-10-29 ENCOUNTER — Encounter: Payer: Self-pay | Admitting: *Deleted

## 2011-12-17 ENCOUNTER — Ambulatory Visit (HOSPITAL_COMMUNITY): Admission: RE | Admit: 2011-12-17 | Payer: BC Managed Care – PPO | Source: Ambulatory Visit

## 2011-12-17 ENCOUNTER — Ambulatory Visit (HOSPITAL_COMMUNITY): Payer: BC Managed Care – PPO | Attending: Cardiology

## 2011-12-25 ENCOUNTER — Other Ambulatory Visit: Payer: Self-pay | Admitting: *Deleted

## 2011-12-25 DIAGNOSIS — I2510 Atherosclerotic heart disease of native coronary artery without angina pectoris: Secondary | ICD-10-CM

## 2011-12-25 DIAGNOSIS — E7800 Pure hypercholesterolemia, unspecified: Secondary | ICD-10-CM

## 2011-12-29 ENCOUNTER — Ambulatory Visit (HOSPITAL_COMMUNITY)
Admission: RE | Admit: 2011-12-29 | Discharge: 2011-12-29 | Disposition: A | Discharge status: Home / Self Care | Payer: BC Managed Care – PPO | Source: Ambulatory Visit | Attending: Adult Health | Admitting: Adult Health

## 2011-12-29 ENCOUNTER — Ambulatory Visit (HOSPITAL_COMMUNITY)
Admission: RE | Admit: 2011-12-29 | Discharge: 2011-12-29 | Disposition: A | Discharge status: Home / Self Care | Payer: BC Managed Care – PPO | Source: Ambulatory Visit | Attending: Cardiology | Admitting: Cardiology

## 2011-12-29 DIAGNOSIS — I059 Rheumatic mitral valve disease, unspecified: Secondary | ICD-10-CM

## 2011-12-29 DIAGNOSIS — I714 Abdominal aortic aneurysm, without rupture, unspecified: Secondary | ICD-10-CM | POA: Insufficient documentation

## 2011-12-29 DIAGNOSIS — R079 Chest pain, unspecified: Secondary | ICD-10-CM | POA: Insufficient documentation

## 2011-12-29 DIAGNOSIS — I2510 Atherosclerotic heart disease of native coronary artery without angina pectoris: Secondary | ICD-10-CM | POA: Insufficient documentation

## 2011-12-29 DIAGNOSIS — Z87891 Personal history of nicotine dependence: Secondary | ICD-10-CM | POA: Insufficient documentation

## 2011-12-29 LAB — LIPID PANEL
HDL: 28 mg/dL — ABNORMAL LOW (ref >39)
LDL Cholesterol: 79 mg/dL (ref 0–99)
Total CHOL/HDL Ratio: 5.8 Ratio
Triglycerides: 269 mg/dL — ABNORMAL HIGH (ref <150)
VLDL: 54 mg/dL — ABNORMAL HIGH (ref 0–40)

## 2011-12-29 LAB — HEPATIC FUNCTION PANEL
Bilirubin, Direct: 0.1 mg/dL (ref 0.0–0.3)
Indirect Bilirubin: 0.5 mg/dL (ref 0.0–0.9)
Total Bilirubin: 0.6 mg/dL (ref 0.3–1.2)
Total Protein: 6.7 g/dL (ref 6.0–8.3)

## 2011-12-29 NOTE — Progress Notes (Signed)
*  PRELIMINARY RESULTS* Echocardiogram 2D Echocardiogram has been performed.  Conrad Hockessin 12/29/2011, 1:55 PM

## 2011-12-29 NOTE — Progress Notes (Deleted)
*  PRELIMINARY RESULTS* Echocardiogram 2D Echocardiogram has been performed.  Conrad Gracemont 12/29/2011, 1:54 PM

## 2011-12-31 ENCOUNTER — Encounter: Payer: Self-pay | Admitting: Cardiology

## 2011-12-31 ENCOUNTER — Ambulatory Visit (INDEPENDENT_AMBULATORY_CARE_PROVIDER_SITE_OTHER): Payer: BC Managed Care – PPO | Admitting: Cardiology

## 2011-12-31 VITALS — BP 107/58 | HR 78 | Ht 71.0 in | Wt 230.1 lb

## 2011-12-31 DIAGNOSIS — R011 Cardiac murmur, unspecified: Secondary | ICD-10-CM | POA: Insufficient documentation

## 2011-12-31 DIAGNOSIS — I7090 Unspecified atherosclerosis: Secondary | ICD-10-CM

## 2011-12-31 DIAGNOSIS — I2510 Atherosclerotic heart disease of native coronary artery without angina pectoris: Secondary | ICD-10-CM

## 2011-12-31 DIAGNOSIS — F17200 Nicotine dependence, unspecified, uncomplicated: Secondary | ICD-10-CM

## 2011-12-31 DIAGNOSIS — F17210 Nicotine dependence, cigarettes, uncomplicated: Secondary | ICD-10-CM | POA: Insufficient documentation

## 2011-12-31 DIAGNOSIS — Z9289 Personal history of other medical treatment: Secondary | ICD-10-CM | POA: Insufficient documentation

## 2011-12-31 DIAGNOSIS — Z720 Tobacco use: Secondary | ICD-10-CM

## 2011-12-31 DIAGNOSIS — E785 Hyperlipidemia, unspecified: Secondary | ICD-10-CM

## 2011-12-31 NOTE — Assessment & Plan Note (Signed)
The importance of discontinuing cigarette smoking discussed.  Patient anticipates a decrease in stress at which time he will once again refrain from tobacco use.

## 2011-12-31 NOTE — Assessment & Plan Note (Signed)
Recent lipid profile is acceptable on high-dose pravastatin.  HDL and triglycerides remains suboptimal at 28 and 269 respectively, but do not mandate additional pharmacologic therapy.  I have recommended exercise and weight loss.

## 2011-12-31 NOTE — Progress Notes (Deleted)
Name: Sean Gomez    DOB: 05-20-1950  Age: 61 y.o.  MR#: [id number]       PCP:  [name], MD      Insurance: @PAYORNAME @   CC:    Chief Complaint  Patient presents with  . Follow-up    VS BP 107/58  Pulse 78  Ht 5\' 11"  (1.803 m)  Wt 230 lb 1.9 oz (104.382 kg)  BMI 32.10 kg/m2  SpO2 97%  Weights Current Weight  12/31/11 230 lb 1.9 oz (104.382 kg)  09/22/11 221 lb 1.9 oz (100.299 kg)  09/11/11 217 lb 2.5 oz (98.5 kg)    Blood Pressure  BP Readings from Last 3 Encounters:  12/31/11 107/58  09/22/11 101/56  09/13/11 117/57     Admit date:  (Not on file) Last encounter with RMR:  09/29/2011   Allergy Allergies  Allergen Reactions  . Atorvastatin Other (See Comments)    myalgias  . Penicillins     Current Outpatient Prescriptions  Medication Sig Dispense Refill  . acetaminophen (TYLENOL) 500 MG tablet Take 1-2 every 6 hours as needed for pain, headache, etc.      . aspirin EC 81 MG EC tablet Take 1 tablet (81 mg total) by mouth daily.      . metoprolol tartrate (LOPRESSOR) 25 MG tablet Take 1 tablet (25 mg total) by mouth 2 (two) times daily.  60 tablet  5  . nitroGLYCERIN (NITROSTAT) 0.4 MG SL tablet Place 1 tablet (0.4 mg total) under the tongue every 5 (five) minutes x 3 doses as needed for chest pain.  25 tablet  11  . prasugrel (EFFIENT) 10 MG TABS Take 1 tablet (10 mg total) by mouth daily.  30 tablet  11  . pravastatin (PRAVACHOL) 80 MG tablet Take 80 mg by mouth daily.      . Simethicone (GAS-X PO) Take 1 capsule by mouth 2 (two) times daily as needed. For gas        Discontinued Meds:    Medications Discontinued During This Encounter  Medication Reason  . ezetimibe (ZETIA) 10 MG tablet Error    Patient Active Problem List  Diagnosis  . Hyperlipidemia  . Heart murmur  . Arteriosclerotic cardiovascular disease (ASCVD)  . Hx of diagnostic tests    LABS Orders Only on 12/25/2011  Component Date Value  . Cholesterol 12/29/2011 161     . Triglycerides 12/29/2011 269*  . HDL 12/29/2011 28*  . Total CHOL/HDL Ratio 12/29/2011 5.8   . VLDL 12/29/2011 54*  . LDL Cholesterol 12/29/2011 79   . Total Bilirubin 12/29/2011 0.6   . Bilirubin, Direct 12/29/2011 0.1   . Indirect Bilirubin 12/29/2011 0.5   . Alkaline Phosphatase 12/29/2011 60   . AST 12/29/2011 18   . ALT 12/29/2011 16   . Total Protein 12/29/2011 6.7   . Albumin 12/29/2011 4.3      Results for this Opt Visit:     Results for orders placed in visit on 12/25/11  LIPID PANEL      Component Value Range   Cholesterol 161  0 - 200 mg/dL   Triglycerides 409 (*) <150 mg/dL   HDL 28 (*) >81 mg/dL   Total CHOL/HDL Ratio 5.8     VLDL 54 (*) 0 - 40 mg/dL   LDL Cholesterol 79  0 - 99 mg/dL  HEPATIC FUNCTION PANEL      Component Value Range   Total Bilirubin 0.6  0.3 - 1.2 mg/dL   Bilirubin,  Direct 0.1  0.0 - 0.3 mg/dL   Indirect Bilirubin 0.5  0.0 - 0.9 mg/dL   Alkaline Phosphatase 60  39 - 117 U/L   AST 18  0 - 37 U/L   ALT 16  0 - 53 U/L   Total Protein 6.7  6.0 - 8.3 g/dL   Albumin 4.3  3.5 - 5.2 g/dL    EKG Orders placed in visit on 09/22/11  . EKG 12-LEAD     Prior Assessment and Plan Problem List as of 12/31/2011            Cardiology Problems   Hyperlipidemia   Last Assessment & Plan Note   09/22/2011 Office Visit Addendum 09/22/2011  2:00 PM by Jodelle Gross, NP    He is unable to tolerate statins due to myalgia's. I have given him samples of Zetia to try and advised him to start fish oil and naisin. He is advised on low cholesterol diet. If he is tolerant of Zetia, will Rx him monthly doses. He will have recheck in 3 months. He plans to start going to the gym and walking in the next week.     Arteriosclerotic cardiovascular disease (ASCVD)     Other   Heart murmur   Hx of diagnostic tests       Imaging: US Aorta  12/29/2011  *RADIOLOGY REPORT*  Clinical Data:  Rule out abdominal aortic aneurysm  ULTRASOUND OF ABDOMINAL AORTA   Technique:  Ultrasound examination of the abdominal aorta was performed to evaluate for abdominal aortic aneurysm.  Comparison: None.  Abdominal Aorta:  No aneurysm identified.        Maximum AP diameter:  2.7 cm.       Maximum TRV diameter:  2.6 cm.  IMPRESSION: No abdominal aortic aneurysm identified.   Original Report Authenticated By: Camelia Phenes, M.D.      [facility] Calculation: Score not calculated. Missing: Total Cholesterol

## 2011-12-31 NOTE — Assessment & Plan Note (Signed)
Prognosis is good with single vessel disease and preserved left ventricular systolic function.  I will reassess this nice gentleman in 8 months at which time effient can be discontinued.

## 2011-12-31 NOTE — Patient Instructions (Addendum)
Your physician recommends that you schedule a follow-up appointment in: 8 months  Stop smoking when possible  Weight loss - Near daily exercise

## 2011-12-31 NOTE — Progress Notes (Signed)
Patient ID: Sean Gomez, male   DOB: [birth year], 61 y.o.   MRN: [id number]  HPI: Scheduled return visit for this nice gentleman with coronary disease and multiple cardiovascular risk factors.  Since suffering a myocardial infarction 3 months ago for which a DES was placed in the RCA, he has done quite well, essentially with no cardiopulmonary symptoms.  He experienced adverse effects with atorvastatin, but has subsequently done well with pravastatin prescribed by his PCP.  A recent lipid profile obtained on treatment demonstrated a reasonably good drug effect.  Unfortunately, Sean Gomez is experiencing substantial stress related to his home and work situation prompting him to resume smoking cigarettes at the rate of 1-1.5 packs per day.  Prior to Admission medications   Medication Sig Start Date End Date Taking? Authorizing Provider  acetaminophen (TYLENOL) 500 MG tablet Take 1-2 every 6 hours as needed for pain, headache, etc. 09/13/11  Yes Beatrice Lecher, PA  aspirin EC 81 MG EC tablet Take 1 tablet (81 mg total) by mouth daily. 09/13/11 09/12/12 Yes Beatrice Lecher, PA  metoprolol tartrate (LOPRESSOR) 25 MG tablet Take 1 tablet (25 mg total) by mouth 2 (two) times daily. 09/13/11 09/12/12 Yes Scott Moishe Spice, PA  nitroGLYCERIN (NITROSTAT) 0.4 MG SL tablet Place 1 tablet (0.4 mg total) under the tongue every 5 (five) minutes x 3 doses as needed for chest pain. 09/13/11 09/12/12 Yes Beatrice Lecher, PA  prasugrel (EFFIENT) 10 MG TABS Take 1 tablet (10 mg total) by mouth daily. 09/13/11  Yes Beatrice Lecher, PA  pravastatin (PRAVACHOL) 80 MG tablet Take 80 mg by mouth daily.   Yes Historical Provider, MD  Simethicone (GAS-X PO) Take 1 capsule by mouth 2 (two) times daily as needed. For gas   Yes Historical Provider, MD   Allergies  Allergen Reactions  . Atorvastatin Other (See Comments)    myalgias  . Penicillins      Past medical history, social history, and family history reviewed and  updated.  ROS: Denies chest pain, dyspnea, orthopnea, PND, lightheadedness or syncope.  All other systems reviewed and are negative.  PHYSICAL EXAM: BP 107/58  Pulse 78  Ht 5\' 11"  (1.803 m)  Wt 104.382 kg (230 lb 1.9 oz)  BMI 32.10 kg/m2  SpO2 97%  General-Well developed; no acute distress Body habitus-Mildly overweight Neck-No JVD; no carotid bruits Lungs-clear lung fields; resonant to percussion Cardiovascular-normal PMI; normal S1 and S2; modest basilar systolic ejection murmur; S4 present Abdomen-normal bowel sounds; soft and non-tender without masses or organomegaly Musculoskeletal-No deformities, no cyanosis or clubbing Neurologic-Normal cranial nerves; symmetric strength and tone Skin-Warm, no significant lesions Extremities-distal pulses intact; no edema  ASSESSMENT AND PLAN:  [name], MD 12/31/2011 3:46 PM

## 2012-03-01 ENCOUNTER — Other Ambulatory Visit (HOSPITAL_COMMUNITY): Payer: Self-pay | Admitting: Physician Assistant

## 2012-03-01 NOTE — Telephone Encounter (Signed)
Fax Received. Refill Completed. Sean Gomez (R.M.A)   

## 2012-03-22 ENCOUNTER — Encounter (HOSPITAL_COMMUNITY): Payer: Self-pay | Admitting: Emergency Medicine

## 2012-03-22 ENCOUNTER — Emergency Department (HOSPITAL_COMMUNITY): Payer: BC Managed Care – PPO

## 2012-03-22 ENCOUNTER — Emergency Department (HOSPITAL_COMMUNITY)
Admission: EM | Admit: 2012-03-22 | Discharge: 2012-03-22 | Disposition: A | Discharge status: Home / Self Care | Payer: BC Managed Care – PPO | Attending: Emergency Medicine | Admitting: Emergency Medicine

## 2012-03-22 DIAGNOSIS — I2510 Atherosclerotic heart disease of native coronary artery without angina pectoris: Secondary | ICD-10-CM | POA: Insufficient documentation

## 2012-03-22 DIAGNOSIS — Z8679 Personal history of other diseases of the circulatory system: Secondary | ICD-10-CM | POA: Insufficient documentation

## 2012-03-22 DIAGNOSIS — Z7982 Long term (current) use of aspirin: Secondary | ICD-10-CM | POA: Insufficient documentation

## 2012-03-22 DIAGNOSIS — E785 Hyperlipidemia, unspecified: Secondary | ICD-10-CM | POA: Insufficient documentation

## 2012-03-22 DIAGNOSIS — R079 Chest pain, unspecified: Secondary | ICD-10-CM

## 2012-03-22 DIAGNOSIS — Z79899 Other long term (current) drug therapy: Secondary | ICD-10-CM | POA: Insufficient documentation

## 2012-03-22 DIAGNOSIS — F17200 Nicotine dependence, unspecified, uncomplicated: Secondary | ICD-10-CM | POA: Insufficient documentation

## 2012-03-22 DIAGNOSIS — R0789 Other chest pain: Secondary | ICD-10-CM | POA: Insufficient documentation

## 2012-03-22 DIAGNOSIS — I252 Old myocardial infarction: Secondary | ICD-10-CM | POA: Insufficient documentation

## 2012-03-22 HISTORY — DX: Acute myocardial infarction, unspecified: I21.9

## 2012-03-22 LAB — HEPATIC FUNCTION PANEL
Albumin: 3.8 g/dL (ref 3.5–5.2)
Alkaline Phosphatase: 80 U/L (ref 39–117)
Total Protein: 6.8 g/dL (ref 6.0–8.3)

## 2012-03-22 LAB — CBC WITH DIFFERENTIAL/PLATELET
Basophils Absolute: 0.1 10*3/uL (ref 0.0–0.1)
Basophils Relative: 1 % (ref 0–1)
Eosinophils Absolute: 0.2 10*3/uL (ref 0.0–0.7)
Eosinophils Relative: 3 % (ref 0–5)
Lymphs Abs: 1.8 10*3/uL (ref 0.7–4.0)
MCH: 31.4 pg (ref 26.0–34.0)
MCV: 89.4 fL (ref 78.0–100.0)
Neutrophils Relative %: 57 % (ref 43–77)
Platelets: 233 10*3/uL (ref 150–400)
RBC: 4.36 MIL/uL (ref 4.22–5.81)
RDW: 12.3 % (ref 11.5–15.5)

## 2012-03-22 LAB — BASIC METABOLIC PANEL
Calcium: 9.3 mg/dL (ref 8.4–10.5)
GFR calc Af Amer: >90 mL/min (ref >90)
GFR calc non Af Amer: >90 mL/min (ref >90)
Glucose, Bld: 102 mg/dL — ABNORMAL HIGH (ref 70–99)
Potassium: 4.4 mEq/L (ref 3.5–5.1)
Sodium: 138 mEq/L (ref 135–145)

## 2012-03-22 LAB — TROPONIN I: Troponin I: <0.3 ng/mL (ref <0.30)

## 2012-03-22 NOTE — ED Notes (Signed)
Called to pt's room.  Requesting his IV access be removed so he can leave. Asked pt if he has been discharged, and he states, "no, I'm sick of waiting".  A&Ox4; denies pain. IV access d/ced.

## 2012-03-22 NOTE — ED Notes (Signed)
Pt states chest pressure intermittently. Belching and pressure as well. Dizziness noted when episodes happen.

## 2012-03-22 NOTE — ED Provider Notes (Signed)
History     CSN: [id number]  Arrival date & time 03/22/12  1030   First MD Initiated Contact with Patient 03/22/12 1134      Chief Complaint  Patient presents with  . Chest Pain    (Consider location/radiation/quality/duration/timing/severity/associated sxs/prior treatment) Patient is a 62 y.o. male presenting with chest pain. The history is provided by the patient (pt states he has had some chest pain with exertion). No language interpreter was used.  Chest Pain The chest pain began 3 - 5 days ago. Chest pain occurs frequently. The chest pain is resolved. The pain is associated with exertion. At its most intense, the pain is at 3/10. The pain is currently at 0/10. The severity of the pain is mild. The pain does not radiate. Chest pain is worsened by exertion. Pertinent negatives for primary symptoms include no fever, no fatigue, no cough and no abdominal pain.  Pertinent negatives for past medical history include no seizures.     Past Medical History  Diagnosis Date  . Bradycardia   . Heart murmur   . Hyperlipidemia   . 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 Tobacco abuse     60 pack years; 1.5 pack per day  . Myocardial infarct     Past Surgical History  Procedure Date  . Appendectomy   . Coronary stent placement     Family History  Problem Relation Age of Onset  . Aortic aneurysm Mother   . Stroke Father   . Coronary artery disease Neg Hx     History  Substance Use Topics  . Smoking status: Current Every Day Smoker -- 2.0 packs/day    Types: Cigarettes  . Smokeless tobacco: Not on file     Comment: Stopped then restarted, total 46 years - currently 2 ppd  . Alcohol Use: No      Review of Systems  Constitutional: Negative for fever and fatigue.  HENT: Negative for congestion, sinus  pressure and ear discharge.   Eyes: Negative for discharge.  Respiratory: Negative for cough.   Cardiovascular: Positive for chest pain.  Gastrointestinal: Negative for abdominal pain and diarrhea.  Genitourinary: Negative for frequency and hematuria.  Musculoskeletal: Negative for back pain.  Skin: Negative for rash.  Neurological: Negative for seizures and headaches.  Hematological: Negative.   Psychiatric/Behavioral: Negative for hallucinations.    Allergies  Atorvastatin and Penicillins  Home Medications   Current Outpatient Rx  Name  Route  Sig  Dispense  Refill  . ASPIRIN 81 MG PO TBEC   Oral   Take 1 tablet (81 mg total) by mouth daily.         Marland Kitchen METOPROLOL TARTRATE 25 MG PO TABS      TAKE 1 TABLET TWICE A DAY   60 tablet   5   . NITROGLYCERIN 0.4 MG SL SUBL   Sublingual   Place 1 tablet (0.4 mg total) under the tongue every 5 (five) minutes x 3 doses as needed for chest pain.   25 tablet   11   . PRASUGREL HCL 10 MG PO TABS   Oral   Take 1 tablet (10 mg total) by mouth daily.   30 tablet   11   . PRAVASTATIN SODIUM 80 MG PO TABS   Oral   Take 80 mg by mouth daily.  BP 105/62  Pulse 77  Temp 98.7 F (37.1 C) (Oral)  Resp 18  Ht 5\' 11"  (1.803 m)  Wt 225 lb (102.059 kg)  BMI 31.38 kg/m2  SpO2 100%  Physical Exam  Constitutional: He is oriented to person, place, and time. He appears well-developed.  HENT:  Head: Normocephalic and atraumatic.  Eyes: Conjunctivae normal and EOM are normal. No scleral icterus.  Neck: Neck supple. No thyromegaly present.  Cardiovascular: Normal rate and regular rhythm.  Exam reveals no gallop and no friction rub.   No murmur heard. Pulmonary/Chest: No stridor. He has no wheezes. He has no rales. He exhibits no tenderness.  Abdominal: He exhibits no distension. There is no tenderness. There is no rebound.  Musculoskeletal: Normal range of motion. He exhibits no edema.  Lymphadenopathy:    He has no  cervical adenopathy.  Neurological: He is oriented to person, place, and time. Coordination normal.  Skin: No rash noted. No erythema.  Psychiatric: He has a normal mood and affect. His behavior is normal.    ED Course  Procedures (including critical care time)  Labs Reviewed  BASIC METABOLIC PANEL - Abnormal; Notable for the following:    Glucose, Bld 102 (*)     All other components within normal limits  CBC WITH DIFFERENTIAL  TROPONIN I  HEPATIC FUNCTION PANEL   Dg Chest Portable 1 View  03/22/2012  *RADIOLOGY REPORT*  Clinical Data: Chest pain  PORTABLE CHEST - 1 VIEW  Comparison: 09/11/2011  Findings: Cardiomediastinal silhouette is stable.  No acute infiltrate or pleural effusion.  No pulmonary edema.  Bony thorax is stable.  No diagnostic pneumothorax.  IMPRESSION: No active disease.  No significant change.   Original Report Authenticated By: Natasha Mead, M.D.      1. Chest pain      Date: 03/22/2012  Rate: 59  Rhythm: sinus bradycardia  QRS Axis: left  Intervals: normal  ST/T Wave abnormalities: normal  Conduction Disutrbances:right bundle branch block  Narrative Interpretation:   Old EKG Reviewed: unchanged    MDM          Benny Lennert, MD 03/22/12 1528

## 2012-09-10 ENCOUNTER — Other Ambulatory Visit (HOSPITAL_COMMUNITY): Payer: Self-pay | Admitting: Physician Assistant

## 2012-09-14 ENCOUNTER — Telehealth: Payer: Self-pay | Admitting: *Deleted

## 2012-09-14 NOTE — Telephone Encounter (Signed)
Called pt advised him to pick up sample bottle at front desk and for him to use it until pt sees Dr Dietrich Pates on 7-21. Pt understood.

## 2012-09-14 NOTE — Telephone Encounter (Signed)
Pt only has two pills left of effeint and was told he could stop at next visit, this is scheduled for 09/19/12. It is noted in last dictation. I told patient that we would call back just to confirm he does not need to get another rx refilled.

## 2012-09-15 ENCOUNTER — Ambulatory Visit: Payer: BC Managed Care – PPO | Admitting: Cardiology

## 2012-09-19 ENCOUNTER — Encounter: Payer: Self-pay | Admitting: Cardiology

## 2012-09-19 ENCOUNTER — Ambulatory Visit (HOSPITAL_COMMUNITY)
Admission: RE | Admit: 2012-09-19 | Discharge: 2012-09-19 | Disposition: A | Discharge status: Home / Self Care | Payer: BC Managed Care – PPO | Source: Ambulatory Visit | Attending: Cardiology | Admitting: Cardiology

## 2012-09-19 ENCOUNTER — Ambulatory Visit (INDEPENDENT_AMBULATORY_CARE_PROVIDER_SITE_OTHER): Payer: BC Managed Care – PPO | Admitting: Cardiology

## 2012-09-19 VITALS — BP 123/55 | HR 82 | Ht 71.0 in | Wt 237.8 lb

## 2012-09-19 DIAGNOSIS — J449 Chronic obstructive pulmonary disease, unspecified: Secondary | ICD-10-CM | POA: Insufficient documentation

## 2012-09-19 DIAGNOSIS — F17200 Nicotine dependence, unspecified, uncomplicated: Secondary | ICD-10-CM

## 2012-09-19 DIAGNOSIS — I10 Essential (primary) hypertension: Secondary | ICD-10-CM

## 2012-09-19 DIAGNOSIS — J4489 Other specified chronic obstructive pulmonary disease: Secondary | ICD-10-CM | POA: Insufficient documentation

## 2012-09-19 DIAGNOSIS — I7090 Unspecified atherosclerosis: Secondary | ICD-10-CM

## 2012-09-19 DIAGNOSIS — I2510 Atherosclerotic heart disease of native coronary artery without angina pectoris: Secondary | ICD-10-CM

## 2012-09-19 DIAGNOSIS — Z720 Tobacco use: Secondary | ICD-10-CM

## 2012-09-19 NOTE — Patient Instructions (Addendum)
Your physician recommends that you schedule a follow-up appointment in: 6 [facility] WITH Dr. Purvis Sheffield   Your physician has requested that you have a stress echocardiogram. For further information please visit [email or web address] Please follow instruction sheet as given.WE WILL CALL YOU WITH THE RESULTS  A chest x-ray takes a picture of the organs and structures inside the chest, including the heart, lungs, and blood vessels. This test can show several things, including, whether the heart is enlarges; whether fluid is building up in the lungs; and whether pacemaker / defibrillator leads are still in place.  Referral placed in pt chart, pt aware that someone will call about upcoming apt date/time FOR CARDIAC REHAB

## 2012-09-19 NOTE — Assessment & Plan Note (Addendum)
Patient congratulated on discontinuation of cigarette smoking. Examination is fairly benign. Chest x-ray and history will be reviewed, but I suspect he does not have limiting COPD. No bronchospasm since smoking discontinued. Formal PFTs will be deferred for now.  If manifestations of possible sleep apnea persist, additional testing can be undertaken.

## 2012-09-19 NOTE — Assessment & Plan Note (Addendum)
Symptoms are somewhat atypical, but similar to past spells with myocardial ischemia. There appears to be a significant component of anxiety, but we will reassess ischemic heart disease with a stress echocardiogram.

## 2012-09-19 NOTE — Progress Notes (Deleted)
Name: Sean Gomez    DOB: [birth year]  Age: 62 y.o.  MR#: [id number]       PCP:  [name], MD      Insurance: Payor: BLUE CROSS BLUE SHIELD / Plan: BCBS [place] PPO / Product Type: *No Product type* /   CC:   No chief complaint on file.   BOTTLES  VS Filed Vitals:   09/19/12 1307  BP: 123/55  Pulse: 82  Height: 5\' 11"  (1.803 m)  Weight: 237 lb 12 oz (107.843 kg)    Weights Current Weight  09/19/12 237 lb 12 oz (107.843 kg)  03/22/12 225 lb (102.059 kg)  12/31/11 230 lb 1.9 oz (104.382 kg)    Blood Pressure  BP Readings from Last 3 Encounters:  09/19/12 123/55  03/22/12 105/62  12/31/11 107/58     Admit date:  (Not on file) Last encounter with RMR:  09/15/2012   Allergy Atorvastatin and Penicillins  Current Outpatient Prescriptions  Medication Sig Dispense Refill  . aspirin 81 MG tablet Take 81 mg by mouth daily.      Marland Kitchen EFFIENT 10 MG TABS TAKE 1 TABLET EVERY DAY  30 tablet  1  . metoprolol tartrate (LOPRESSOR) 25 MG tablet TAKE 1 TABLET TWICE A DAY  60 tablet  5  . pravastatin (PRAVACHOL) 80 MG tablet Take 80 mg by mouth daily.      . nitroGLYCERIN (NITROSTAT) 0.4 MG SL tablet Place 1 tablet (0.4 mg total) under the tongue every 5 (five) minutes x 3 doses as needed for chest pain.  25 tablet  11   No current facility-administered medications for this visit.    Discontinued Meds:   There are no discontinued medications.  Patient Active Problem List   Diagnosis Date Noted  . Hx of diagnostic tests 12/31/2011  . Heart murmur   . Tobacco abuse   . Hyperlipidemia 09/12/2011  . Arteriosclerotic cardiovascular disease (ASCVD) 08/31/2011    LABS    Component Value Date/Time   NA 138 03/22/2012 1119   NA 138 09/22/2011 1338   NA 143 09/12/2011 0500   K 4.4 03/22/2012 1119   K 4.3 09/22/2011 1338   K 4.1 09/12/2011 0500   CL 103 03/22/2012 1119   CL 105 09/22/2011 1338   CL 110 09/12/2011 0500   CO2 27 03/22/2012 1119   CO2 26 09/22/2011 1338   CO2 25  09/12/2011 0500   GLUCOSE 102* 03/22/2012 1119   GLUCOSE 104* 09/22/2011 1338   GLUCOSE 105* 09/12/2011 0500   BUN 14 03/22/2012 1119   BUN 15 09/22/2011 1338   BUN 13 09/12/2011 0500   CREATININE 0.87 03/22/2012 1119   CREATININE 0.85 09/22/2011 1338   CREATININE 0.89 09/12/2011 0500   CALCIUM 9.3 03/22/2012 1119   CALCIUM 9.1 09/22/2011 1338   CALCIUM 8.7 09/12/2011 0500   GFRNONAA >90 03/22/2012 1119   GFRNONAA >90 09/12/2011 0500   GFRAA >90 03/22/2012 1119   GFRAA >90 09/12/2011 0500   CMP     Component Value Date/Time   NA 138 03/22/2012 1119   K 4.4 03/22/2012 1119   CL 103 03/22/2012 1119   CO2 27 03/22/2012 1119   GLUCOSE 102* 03/22/2012 1119   BUN 14 03/22/2012 1119   CREATININE 0.87 03/22/2012 1119   CREATININE 0.85 09/22/2011 1338   CALCIUM 9.3 03/22/2012 1119   PROT 6.8 03/22/2012 1119   ALBUMIN 3.8 03/22/2012 1119   AST 19 03/22/2012 1119   ALT 15 03/22/2012 1119  ALKPHOS 80 03/22/2012 1119   BILITOT 0.5 03/22/2012 1119   GFRNONAA >90 03/22/2012 1119   GFRAA >90 03/22/2012 1119       Component Value Date/Time   WBC 5.8 03/22/2012 1057   WBC 8.0 09/12/2011 0525   HGB 13.7 03/22/2012 1057   HGB 13.2 09/12/2011 0525   HCT 39.0 03/22/2012 1057   HCT 39.2 09/12/2011 0525   MCV 89.4 03/22/2012 1057   MCV 90.7 09/12/2011 0525    Lipid Panel     Component Value Date/Time   CHOL 161 12/29/2011 0840   TRIG 269* 12/29/2011 0840   HDL 28* 12/29/2011 0840   CHOLHDL 5.8 12/29/2011 0840   VLDL 54* 12/29/2011 0840   LDLCALC 79 12/29/2011 0840    ABG No results found for this basename: phart, pco2, pco2art, po2, po2art, hco3, tco2, acidbasedef, o2sat     Lab Results  Component Value Date   TSH 1.349 09/11/2011   BNP (last 3 results) No results found for this basename: PROBNP,  in the last 8760 hours Cardiac Panel (last 3 results) No results found for this basename: CKTOTAL, CKMB, TROPONINI, RELINDX,  in the last 72 hours  Iron/TIBC/Ferritin No results found for this basename: iron,  tibc, ferritin     EKG Orders placed during the hospital encounter of 03/22/12  . ED EKG  . ED EKG  . EKG 12-LEAD  . EKG 12-LEAD  . EKG     Prior Assessment and Plan Problem List as of 09/19/2012     Cardiovascular and Mediastinum   Arteriosclerotic cardiovascular disease (ASCVD)   Last Assessment & Plan   12/31/2011 Office Visit Written 12/31/2011  3:52 PM by Kathlen Brunswick, MD     Prognosis is good with single vessel disease and preserved left ventricular systolic function.  I will reassess this nice gentleman in 8 months at which time effient can be discontinued.      Other   Hyperlipidemia   Last Assessment & Plan   12/31/2011 Office Visit Written 12/31/2011  3:50 PM by Kathlen Brunswick, MD     Recent lipid profile is acceptable on high-dose pravastatin.  HDL and triglycerides remains suboptimal at 28 and 269 respectively, but do not mandate additional pharmacologic therapy.  I have recommended exercise and weight loss.    Heart murmur   Hx of diagnostic tests   Tobacco abuse   Last Assessment & Plan   12/31/2011 Office Visit Written 12/31/2011  3:51 PM by Kathlen Brunswick, MD     The importance of discontinuing cigarette smoking discussed.  Patient anticipates a decrease in stress at which time he will once again refrain from tobacco use.        Imaging: No results found.

## 2012-09-23 NOTE — Progress Notes (Signed)
Patient ID: Sean Gomez, male   DOB: 01-May-1950, 62 y.o.   MRN: [id number]  HPI: Scheduled return visit for this nice gentleman with coronary artery disease recently evaluated in the [facility] emergency department for chest discomfort associated with excessive eructation. This presentation was similar to a prior acute coronary syndrome.  Due to a delay in physician evaluation and resolution of symptoms, the patient left ED, but subsequently returned to the ED at [facility] with recurrent chest discomfort and dyspnea.  Patient discontinued tobacco use at the time of diagnosis of coronary disease a year ago. This has resulted in improved respiratory symptoms. He has been told of a heart murmur for decades.  Current Outpatient Prescriptions  Medication Sig Dispense Refill  . aspirin 81 MG tablet Take 81 mg by mouth daily.      Marland Kitchen EFFIENT 10 MG TABS TAKE 1 TABLET EVERY DAY  30 tablet  1  . metoprolol tartrate (LOPRESSOR) 25 MG tablet TAKE 1 TABLET TWICE A DAY  60 tablet  5  . pravastatin (PRAVACHOL) 80 MG tablet Take 80 mg by mouth daily.      . nitroGLYCERIN (NITROSTAT) 0.4 MG SL tablet Place 1 tablet (0.4 mg total) under the tongue every 5 (five) minutes x 3 doses as needed for chest pain.  25 tablet  11   No current facility-administered medications for this visit.   Allergies  Allergen Reactions  . Atorvastatin Other (See Comments)    myalgias  . Penicillins      Past medical history, social history, and family history reviewed and updated.  ROS: Denies anemia, GI blood loss, history of thyroid disease. Patient snores and notes some daytime fatigue and perhaps somnolence. He reportedly underwent a negative sleep study 10 years ago. He continues to snore. All other systems reviewed and are negative.  PHYSICAL EXAM: BP 123/55  Pulse 82  Ht 5\' 11"  (1.803 m)  Wt 107.843 kg (237 lb 12 oz)  BMI 33.17 kg/m2;  Body mass index is 33.17 kg/(m^2). General-Well developed; no acute  distress Body habitus-mild to moderately overweight Neck-No JVD; no carotid bruits Lungs-clear lung fields; resonant to percussion Cardiovascular-normal PMI; normal S1 and S2; modest systolic ejection murmur at the left sternal border Abdomen-normal bowel sounds; soft and non-tender without masses or organomegaly Musculoskeletal-No deformities, no cyanosis or clubbing Neurologic-Normal cranial nerves; symmetric strength and tone Skin-Warm, no significant lesions Extremities-distal pulses intact; no edema  EKG: 09/19/12 normal sinus rhythm; incomplete right bundle branch block; left axis deviation.  Ordway Bing, MD 09/23/2012  9:41 PM  ASSESSMENT AND PLAN

## 2012-09-28 ENCOUNTER — Encounter: Payer: Self-pay | Admitting: Cardiology

## 2012-09-28 DIAGNOSIS — R918 Other nonspecific abnormal finding of lung field: Secondary | ICD-10-CM | POA: Insufficient documentation

## 2012-09-29 ENCOUNTER — Other Ambulatory Visit (HOSPITAL_COMMUNITY): Payer: Self-pay | Admitting: Cardiology

## 2012-09-29 ENCOUNTER — Encounter: Payer: Self-pay | Admitting: Cardiology

## 2012-09-30 ENCOUNTER — Telehealth: Payer: Self-pay | Admitting: *Deleted

## 2012-09-30 ENCOUNTER — Encounter (HOSPITAL_COMMUNITY): Payer: Self-pay

## 2012-09-30 ENCOUNTER — Ambulatory Visit (HOSPITAL_COMMUNITY)
Admission: RE | Admit: 2012-09-30 | Discharge: 2012-09-30 | Disposition: A | Discharge status: Home / Self Care | Payer: BC Managed Care – PPO | Source: Ambulatory Visit | Attending: Cardiology | Admitting: Cardiology

## 2012-09-30 DIAGNOSIS — R9389 Abnormal findings on diagnostic imaging of other specified body structures: Secondary | ICD-10-CM

## 2012-09-30 DIAGNOSIS — J449 Chronic obstructive pulmonary disease, unspecified: Secondary | ICD-10-CM | POA: Insufficient documentation

## 2012-09-30 DIAGNOSIS — I10 Essential (primary) hypertension: Secondary | ICD-10-CM | POA: Insufficient documentation

## 2012-09-30 DIAGNOSIS — Z720 Tobacco use: Secondary | ICD-10-CM

## 2012-09-30 DIAGNOSIS — J4489 Other specified chronic obstructive pulmonary disease: Secondary | ICD-10-CM | POA: Insufficient documentation

## 2012-09-30 DIAGNOSIS — I2510 Atherosclerotic heart disease of native coronary artery without angina pectoris: Secondary | ICD-10-CM | POA: Insufficient documentation

## 2012-09-30 DIAGNOSIS — Z87891 Personal history of nicotine dependence: Secondary | ICD-10-CM | POA: Insufficient documentation

## 2012-09-30 NOTE — Telephone Encounter (Signed)
Placed order in chart for pt to have Ct scan as advised, pending pt apt from [facility], will contact pt with apt date and time once available

## 2012-09-30 NOTE — Progress Notes (Signed)
Stress Lab Nurses Notes - Sean Gomez 09/30/2012  Reason for doing test: Chest Pain and adjustment of meds (Effifent)  Type of test: Stress Echo  Nurse performing test: Marlena Clipper, RN  Nuclear Medicine Tech: Sedonia Small Rodgerserror Nlb RN  Echo Tech: Aida Raider  MD performing test: R. Dietrich Pates  Family MD: Catha Gosselin  Test explained and consent signed: yes  IV started: No IV started  Symptoms:   Treatment/Intervention: None  Reason test stopped: fatigue, reached target HR and SOB  After recovery IV was: No IV  Patient to return to Nuc. Med at :N/A  Patient discharged: Home  Patient's Condition upon discharge was: stable  Comments: Patient walked 9 min and 7 seconds. His peak HR was 160 and Peak BP was 150/78. Recovery Hr was 99 and BP during Recovery was 130.67.Symptoms resolved in recovery.  Sean Gomez

## 2012-09-30 NOTE — Telephone Encounter (Signed)
Message copied by Ovidio Kin on Fri Sep 30, 2012 10:37 AM ------      Message from: Kathlen Brunswick      Created: Wed Sep 28, 2012  1:52 AM      Regarding: X-ray       Abnormal chest x-ray at [facility] in 06/2012.      Orders CT of chest without contrast for nodular abnormality in right upper lobe on chest x-ray. ------

## 2012-10-03 NOTE — Telephone Encounter (Signed)
Pt scheduled for 10-05-12 at 10am, pt aware results will be called into him after test

## 2012-10-05 ENCOUNTER — Ambulatory Visit (HOSPITAL_COMMUNITY)
Admission: RE | Admit: 2012-10-05 | Discharge: 2012-10-05 | Disposition: A | Discharge status: Home / Self Care | Payer: BC Managed Care – PPO | Source: Ambulatory Visit | Attending: Cardiology | Admitting: Cardiology

## 2012-10-05 DIAGNOSIS — R9389 Abnormal findings on diagnostic imaging of other specified body structures: Secondary | ICD-10-CM

## 2012-10-05 DIAGNOSIS — R911 Solitary pulmonary nodule: Secondary | ICD-10-CM | POA: Insufficient documentation

## 2012-10-05 DIAGNOSIS — Z09 Encounter for follow-up examination after completed treatment for conditions other than malignant neoplasm: Secondary | ICD-10-CM | POA: Insufficient documentation

## 2012-10-09 ENCOUNTER — Encounter: Payer: Self-pay | Admitting: Cardiology

## 2012-10-10 ENCOUNTER — Encounter: Payer: Self-pay | Admitting: *Deleted

## 2012-10-10 ENCOUNTER — Encounter (HOSPITAL_COMMUNITY): Payer: BC Managed Care – PPO

## 2012-10-18 ENCOUNTER — Encounter: Payer: Self-pay | Admitting: Cardiology

## 2012-10-19 ENCOUNTER — Encounter: Payer: Self-pay | Admitting: Cardiology

## 2012-10-19 ENCOUNTER — Encounter (HOSPITAL_COMMUNITY)
Admission: RE | Admit: 2012-10-19 | Discharge: 2012-10-19 | Disposition: A | Discharge status: Home / Self Care | Payer: BC Managed Care – PPO | Source: Ambulatory Visit | Attending: Cardiovascular Disease | Admitting: Cardiovascular Disease

## 2012-10-19 VITALS — BP 90/62 | HR 50 | Ht 71.0 in | Wt 244.4 lb

## 2012-10-19 DIAGNOSIS — Z5189 Encounter for other specified aftercare: Secondary | ICD-10-CM | POA: Insufficient documentation

## 2012-10-19 DIAGNOSIS — I219 Acute myocardial infarction, unspecified: Secondary | ICD-10-CM

## 2012-10-19 DIAGNOSIS — I252 Old myocardial infarction: Secondary | ICD-10-CM | POA: Insufficient documentation

## 2012-10-19 DIAGNOSIS — Z9861 Coronary angioplasty status: Secondary | ICD-10-CM | POA: Insufficient documentation

## 2012-10-19 NOTE — Patient Instructions (Signed)
Pt has finished orientation and is scheduled to start CR on 10/24/12 at 9:30am. Pt has been instructed to arrive to class 15 minutes early for scheduled class. Pt has been instructed to wear comfortable clothing and shoes with rubber soles. Pt has been told to take their medications 1 hour prior to coming to class.  If the patient is not going to attend class, he/she has been instructed to call.

## 2012-10-19 NOTE — Progress Notes (Signed)
Patient was referred by Dr. Coker Bing due to MI 410.90 and Stentx1 V45.82. During orientation advised patient on arrival and appointment times what to wear, what to do before, during and after exercise. Reviewed attendance and class policy. Talked about inclement weather and class consultation policy. Pt is scheduled to start Cardiac Rehab on 10/24/12 at 9:30. Pt was advised to come to class 5 minutes before class starts. He was also given instructions on meeting with the dietician and attending the Family Structure classes. Pt is eager to get started. Finished the 6 minute walk test.

## 2012-10-24 ENCOUNTER — Encounter (HOSPITAL_COMMUNITY)
Admission: RE | Admit: 2012-10-24 | Discharge: 2012-10-24 | Disposition: A | Discharge status: Home / Self Care | Payer: BC Managed Care – PPO | Source: Ambulatory Visit | Attending: Cardiovascular Disease | Admitting: Cardiovascular Disease

## 2012-10-26 ENCOUNTER — Encounter (HOSPITAL_COMMUNITY)
Admission: RE | Admit: 2012-10-26 | Discharge: 2012-10-26 | Disposition: A | Discharge status: Home / Self Care | Payer: BC Managed Care – PPO | Source: Ambulatory Visit | Attending: Cardiovascular Disease | Admitting: Cardiovascular Disease

## 2012-10-28 ENCOUNTER — Encounter (HOSPITAL_COMMUNITY)
Admission: RE | Admit: 2012-10-28 | Discharge: 2012-10-28 | Disposition: A | Discharge status: Home / Self Care | Payer: BC Managed Care – PPO | Source: Ambulatory Visit | Attending: Cardiovascular Disease | Admitting: Cardiovascular Disease

## 2012-10-31 ENCOUNTER — Encounter (HOSPITAL_COMMUNITY): Payer: BC Managed Care – PPO

## 2012-11-02 ENCOUNTER — Encounter (HOSPITAL_COMMUNITY)
Admission: RE | Admit: 2012-11-02 | Discharge: 2012-11-02 | Disposition: A | Discharge status: Home / Self Care | Payer: BC Managed Care – PPO | Source: Ambulatory Visit | Attending: Cardiovascular Disease | Admitting: Cardiovascular Disease

## 2012-11-02 DIAGNOSIS — Z9861 Coronary angioplasty status: Secondary | ICD-10-CM | POA: Insufficient documentation

## 2012-11-02 DIAGNOSIS — I252 Old myocardial infarction: Secondary | ICD-10-CM | POA: Insufficient documentation

## 2012-11-02 DIAGNOSIS — Z5189 Encounter for other specified aftercare: Secondary | ICD-10-CM | POA: Insufficient documentation

## 2012-11-04 ENCOUNTER — Encounter (HOSPITAL_COMMUNITY)
Admission: RE | Admit: 2012-11-04 | Discharge: 2012-11-04 | Disposition: A | Discharge status: Home / Self Care | Payer: BC Managed Care – PPO | Source: Ambulatory Visit | Attending: Cardiovascular Disease | Admitting: Cardiovascular Disease

## 2012-11-07 ENCOUNTER — Encounter (HOSPITAL_COMMUNITY)
Admission: RE | Admit: 2012-11-07 | Discharge: 2012-11-07 | Disposition: A | Discharge status: Home / Self Care | Payer: BC Managed Care – PPO | Source: Ambulatory Visit | Attending: Cardiovascular Disease | Admitting: Cardiovascular Disease

## 2012-11-09 ENCOUNTER — Encounter (HOSPITAL_COMMUNITY)
Admission: RE | Admit: 2012-11-09 | Discharge: 2012-11-09 | Disposition: A | Discharge status: Home / Self Care | Payer: BC Managed Care – PPO | Source: Ambulatory Visit | Attending: Cardiovascular Disease | Admitting: Cardiovascular Disease

## 2012-11-11 ENCOUNTER — Encounter (HOSPITAL_COMMUNITY)
Admission: RE | Admit: 2012-11-11 | Discharge: 2012-11-11 | Disposition: A | Discharge status: Home / Self Care | Payer: BC Managed Care – PPO | Source: Ambulatory Visit | Attending: Cardiovascular Disease | Admitting: Cardiovascular Disease

## 2012-11-14 ENCOUNTER — Encounter (HOSPITAL_COMMUNITY)
Admission: RE | Admit: 2012-11-14 | Discharge: 2012-11-14 | Disposition: A | Discharge status: Home / Self Care | Payer: BC Managed Care – PPO | Source: Ambulatory Visit | Attending: Cardiovascular Disease | Admitting: Cardiovascular Disease

## 2012-11-16 ENCOUNTER — Encounter (HOSPITAL_COMMUNITY)
Admission: RE | Admit: 2012-11-16 | Discharge: 2012-11-16 | Disposition: A | Discharge status: Home / Self Care | Payer: BC Managed Care – PPO | Source: Ambulatory Visit | Attending: Cardiovascular Disease | Admitting: Cardiovascular Disease

## 2012-11-18 ENCOUNTER — Encounter (HOSPITAL_COMMUNITY): Payer: BC Managed Care – PPO

## 2012-11-21 ENCOUNTER — Encounter (HOSPITAL_COMMUNITY)
Admission: RE | Admit: 2012-11-21 | Discharge: 2012-11-21 | Disposition: A | Discharge status: Home / Self Care | Payer: BC Managed Care – PPO | Source: Ambulatory Visit | Attending: Cardiovascular Disease | Admitting: Cardiovascular Disease

## 2012-11-23 ENCOUNTER — Encounter (HOSPITAL_COMMUNITY)
Admission: RE | Admit: 2012-11-23 | Discharge: 2012-11-23 | Disposition: A | Discharge status: Home / Self Care | Payer: BC Managed Care – PPO | Source: Ambulatory Visit | Attending: Cardiovascular Disease | Admitting: Cardiovascular Disease

## 2012-11-25 ENCOUNTER — Encounter (HOSPITAL_COMMUNITY)
Admission: RE | Admit: 2012-11-25 | Discharge: 2012-11-25 | Disposition: A | Discharge status: Home / Self Care | Payer: BC Managed Care – PPO | Source: Ambulatory Visit | Attending: Cardiovascular Disease | Admitting: Cardiovascular Disease

## 2012-11-28 ENCOUNTER — Encounter (HOSPITAL_COMMUNITY)
Admission: RE | Admit: 2012-11-28 | Discharge: 2012-11-28 | Disposition: A | Discharge status: Home / Self Care | Payer: BC Managed Care – PPO | Source: Ambulatory Visit | Attending: Cardiovascular Disease | Admitting: Cardiovascular Disease

## 2012-11-30 ENCOUNTER — Encounter (HOSPITAL_COMMUNITY)
Admission: RE | Admit: 2012-11-30 | Discharge: 2012-11-30 | Disposition: A | Discharge status: Home / Self Care | Payer: BC Managed Care – PPO | Source: Ambulatory Visit | Attending: Cardiovascular Disease | Admitting: Cardiovascular Disease

## 2012-11-30 DIAGNOSIS — I252 Old myocardial infarction: Secondary | ICD-10-CM | POA: Insufficient documentation

## 2012-11-30 DIAGNOSIS — Z9861 Coronary angioplasty status: Secondary | ICD-10-CM | POA: Insufficient documentation

## 2012-11-30 DIAGNOSIS — Z5189 Encounter for other specified aftercare: Secondary | ICD-10-CM | POA: Insufficient documentation

## 2012-12-02 ENCOUNTER — Encounter (HOSPITAL_COMMUNITY)
Admission: RE | Admit: 2012-12-02 | Discharge: 2012-12-02 | Disposition: A | Discharge status: Home / Self Care | Payer: BC Managed Care – PPO | Source: Ambulatory Visit | Attending: Cardiovascular Disease | Admitting: Cardiovascular Disease

## 2012-12-05 ENCOUNTER — Encounter (HOSPITAL_COMMUNITY)
Admission: RE | Admit: 2012-12-05 | Discharge: 2012-12-05 | Disposition: A | Discharge status: Home / Self Care | Payer: BC Managed Care – PPO | Source: Ambulatory Visit | Attending: Cardiovascular Disease | Admitting: Cardiovascular Disease

## 2012-12-06 ENCOUNTER — Institutional Professional Consult (permissible substitution): Payer: BC Managed Care – PPO | Admitting: Internal Medicine

## 2012-12-07 ENCOUNTER — Encounter (HOSPITAL_COMMUNITY)
Admission: RE | Admit: 2012-12-07 | Discharge: 2012-12-07 | Disposition: A | Discharge status: Home / Self Care | Payer: BC Managed Care – PPO | Source: Ambulatory Visit | Attending: Cardiovascular Disease | Admitting: Cardiovascular Disease

## 2012-12-09 ENCOUNTER — Encounter (HOSPITAL_COMMUNITY)
Admission: RE | Admit: 2012-12-09 | Discharge: 2012-12-09 | Disposition: A | Discharge status: Home / Self Care | Payer: BC Managed Care – PPO | Source: Ambulatory Visit | Attending: Cardiovascular Disease | Admitting: Cardiovascular Disease

## 2012-12-12 ENCOUNTER — Encounter (HOSPITAL_COMMUNITY)
Admission: RE | Admit: 2012-12-12 | Discharge: 2012-12-12 | Disposition: A | Discharge status: Home / Self Care | Payer: BC Managed Care – PPO | Source: Ambulatory Visit | Attending: Cardiovascular Disease | Admitting: Cardiovascular Disease

## 2012-12-13 ENCOUNTER — Encounter: Payer: Self-pay | Admitting: Internal Medicine

## 2012-12-13 ENCOUNTER — Ambulatory Visit (INDEPENDENT_AMBULATORY_CARE_PROVIDER_SITE_OTHER): Payer: BC Managed Care – PPO | Admitting: Internal Medicine

## 2012-12-13 VITALS — BP 120/72 | HR 55 | Temp 97.2°F | Ht 71.0 in | Wt 248.0 lb

## 2012-12-13 DIAGNOSIS — J449 Chronic obstructive pulmonary disease, unspecified: Secondary | ICD-10-CM

## 2012-12-13 DIAGNOSIS — Z87891 Personal history of nicotine dependence: Secondary | ICD-10-CM

## 2012-12-13 DIAGNOSIS — R918 Other nonspecific abnormal finding of lung field: Secondary | ICD-10-CM

## 2012-12-13 DIAGNOSIS — R9389 Abnormal findings on diagnostic imaging of other specified body structures: Secondary | ICD-10-CM

## 2012-12-13 NOTE — Patient Instructions (Addendum)
Congratulations on not smoking - it's the most important aspect of your care  You will need a follow up CT chest and set of PFT's in 09/2013  - we will place tickle file for recall

## 2012-12-13 NOTE — Progress Notes (Signed)
  Subjective:    Patient ID: Sean Gomez, male    DOB: [birth year]  MRN: [id number]  HPI  65 yowm quit smoking June 2014 with wt 224 with cough with nasal congestion/ drainage x one year but completely  resolved p quit  referred 12/13/2012 by Dr Catha Gosselin for eval of abn ct chest first ordered by  Dr Dietrich Pates.  12/13/2012 1st [facility] office visit/EPIC era Lonzell Dorris cc doe x heavy lifting but able to do fine with rehab s cp or sob ""they tell me to slow down due to my heart, not because I'm winded"  No obvious day to day or daytime variabilty or assoc chronic cough or cp or chest tightness, subjective wheeze overt sinus or hb symptoms. No unusual exp hx or h/o childhood pna/ asthma or knowledge of premature birth.  Sleeping ok without nocturnal  or early am exacerbation  of respiratory  c/o's or need for noct saba. Also denies any obvious fluctuation of symptoms with weather or environmental changes or other aggravating or alleviating factors except as outlined above   Current Medications, Allergies, Complete Past Medical History, Past Surgical History, Family History, and Social History were reviewed in [employer] record.           Review of Systems  Constitutional: Positive for unexpected weight change. Negative for fever.  HENT: Positive for congestion, postnasal drip and sinus pressure. Negative for dental problem, ear pain, nosebleeds, rhinorrhea, sneezing, sore throat and trouble swallowing.   Eyes: Negative for redness and itching.  Respiratory: Positive for shortness of breath. Negative for cough, chest tightness and wheezing.   Cardiovascular: Negative for palpitations and leg swelling.  Gastrointestinal: Negative for nausea and vomiting.  Genitourinary: Negative for dysuria.  Musculoskeletal: Negative for joint swelling.  Skin: Negative for rash.  Neurological: Negative for headaches.  Hematological: Bruises/bleeds easily.   Psychiatric/Behavioral: Positive for dysphoric mood. The patient is nervous/anxious.        Objective:   Physical Exam   amb mod obese wm nad Wt Readings from Last 3 Encounters:  12/13/12 248 lb (112.492 kg)  10/19/12 244 lb 6.4 oz (110.859 kg)  09/19/12 237 lb 12 oz (107.843 kg)      HEENT mild turbinate edema.  Oropharynx no thrush or excess pnd or cobblestoning.  No JVD or cervical adenopathy. Mild accessory muscle hypertrophy. Trachea midline, nl thryroid. Chest was mildly hyperinflated by percussion with diminished breath sounds and mild increased exp time without wheeze. Hoover sign positive at late  inspiration. Regular rate and rhythm without murmur gallop or rub or increase P2 or edema.  Abd: no hsm, nl excursion. Ext warm without cyanosis or clubbing.    CT Chest 10/13/12 1. Pattern of upper lung zone predominant peribronchovascular  nodularity and mild architectural distortion, with suspected  involvement of the left major fissure. Findings can be seen with  sarcoid. Post infectious scarring is another consideration.  2. Largest individual nodule measures 4 mm. Given risk factors for  bronchogenic carcinoma, follow-up chest CT at 1 year is  recommended.       Assessment & Plan:

## 2012-12-14 ENCOUNTER — Encounter (HOSPITAL_COMMUNITY)
Admission: RE | Admit: 2012-12-14 | Discharge: 2012-12-14 | Disposition: A | Discharge status: Home / Self Care | Payer: BC Managed Care – PPO | Source: Ambulatory Visit | Attending: Cardiovascular Disease | Admitting: Cardiovascular Disease

## 2012-12-14 DIAGNOSIS — J449 Chronic obstructive pulmonary disease, unspecified: Secondary | ICD-10-CM | POA: Insufficient documentation

## 2012-12-14 NOTE — Assessment & Plan Note (Signed)
Relatively mild and not limiting at present so no need for rx. Note cough and "drainage" resolved with smoking cessation.  I reviewed the Flethcher curve with patient that basically indicates  if you quit smoking when your best day FEV1 is still well preserved it is highly unlikely you will progress to severe disease and informed the patient there was no medication on the market that has proven to change the curve or the likelihood of progression.  Therefore   maintaining abstinence is the most important aspect of care, not choice of inhalers or for that matter, doctors.

## 2012-12-14 NOTE — Assessment & Plan Note (Addendum)
5/2014[facility] ED nodular density over the right upper lobe.  09/2012:CT-upper lung zone predominant peribronchovascular nodularity, ?sarcoid, post infectious scarring; largest nodule-4 mm. + risk factors for carcinoma, follow-up CT at 1 year is  Recommended.  Copy sent to Dr. Wyline Beady for further evaluation and Rx. See CT 10/13/12  1. Pattern of upper lung zone predominant peribronchovascular  nodularity and mild architectural distortion, with suspected  involvement of the left major fissure. Findings can be seen with  sarcoid. Post infectious scarring is another consideration.  2. Largest individual nodule measures 4 mm. Given risk factors for  bronchogenic carcinoma, follow-up chest CT at 1 year is  recommended.   Reviewed guidelines with pt - no need for repeat ct until 09/2013 given how small the nodules are but most likely they are benign or related to remote process like sarcoid or other granulomatous dz and in the absence of more symptoms would not attempt tissue dx at this point Placed in tickle file for recall

## 2012-12-14 NOTE — Assessment & Plan Note (Signed)
See comments under copd a/p

## 2012-12-16 ENCOUNTER — Encounter (HOSPITAL_COMMUNITY): Payer: BC Managed Care – PPO

## 2012-12-19 ENCOUNTER — Encounter (HOSPITAL_COMMUNITY)
Admission: RE | Admit: 2012-12-19 | Discharge: 2012-12-19 | Disposition: A | Discharge status: Home / Self Care | Payer: BC Managed Care – PPO | Source: Ambulatory Visit | Attending: Cardiovascular Disease | Admitting: Cardiovascular Disease

## 2012-12-21 ENCOUNTER — Encounter (HOSPITAL_COMMUNITY)
Admission: RE | Admit: 2012-12-21 | Discharge: 2012-12-21 | Disposition: A | Discharge status: Home / Self Care | Payer: BC Managed Care – PPO | Source: Ambulatory Visit | Attending: Cardiovascular Disease | Admitting: Cardiovascular Disease

## 2012-12-23 ENCOUNTER — Encounter (HOSPITAL_COMMUNITY)
Admission: RE | Admit: 2012-12-23 | Discharge: 2012-12-23 | Disposition: A | Discharge status: Home / Self Care | Payer: BC Managed Care – PPO | Source: Ambulatory Visit | Attending: Cardiovascular Disease | Admitting: Cardiovascular Disease

## 2012-12-26 ENCOUNTER — Encounter (HOSPITAL_COMMUNITY)
Admission: RE | Admit: 2012-12-26 | Discharge: 2012-12-26 | Disposition: A | Discharge status: Home / Self Care | Payer: BC Managed Care – PPO | Source: Ambulatory Visit | Attending: Cardiovascular Disease | Admitting: Cardiovascular Disease

## 2012-12-28 ENCOUNTER — Encounter (HOSPITAL_COMMUNITY)
Admission: RE | Admit: 2012-12-28 | Discharge: 2012-12-28 | Disposition: A | Discharge status: Home / Self Care | Payer: BC Managed Care – PPO | Source: Ambulatory Visit | Attending: Cardiovascular Disease | Admitting: Cardiovascular Disease

## 2012-12-30 ENCOUNTER — Encounter (HOSPITAL_COMMUNITY)
Admission: RE | Admit: 2012-12-30 | Discharge: 2012-12-30 | Disposition: A | Discharge status: Home / Self Care | Payer: BC Managed Care – PPO | Source: Ambulatory Visit | Attending: Cardiovascular Disease | Admitting: Cardiovascular Disease

## 2013-01-02 ENCOUNTER — Encounter (HOSPITAL_COMMUNITY)
Admission: RE | Admit: 2013-01-02 | Discharge: 2013-01-02 | Disposition: A | Discharge status: Home / Self Care | Payer: BC Managed Care – PPO | Source: Ambulatory Visit | Attending: Cardiovascular Disease | Admitting: Cardiovascular Disease

## 2013-01-02 DIAGNOSIS — I252 Old myocardial infarction: Secondary | ICD-10-CM | POA: Insufficient documentation

## 2013-01-02 DIAGNOSIS — Z9861 Coronary angioplasty status: Secondary | ICD-10-CM | POA: Insufficient documentation

## 2013-01-02 DIAGNOSIS — Z5189 Encounter for other specified aftercare: Secondary | ICD-10-CM | POA: Insufficient documentation

## 2013-01-04 ENCOUNTER — Encounter (HOSPITAL_COMMUNITY)
Admission: RE | Admit: 2013-01-04 | Discharge: 2013-01-04 | Disposition: A | Discharge status: Home / Self Care | Payer: BC Managed Care – PPO | Source: Ambulatory Visit | Attending: Cardiovascular Disease | Admitting: Cardiovascular Disease

## 2013-01-06 ENCOUNTER — Encounter (HOSPITAL_COMMUNITY)
Admission: RE | Admit: 2013-01-06 | Discharge: 2013-01-06 | Disposition: A | Discharge status: Home / Self Care | Payer: BC Managed Care – PPO | Source: Ambulatory Visit | Attending: Cardiovascular Disease | Admitting: Cardiovascular Disease

## 2013-01-09 ENCOUNTER — Encounter (HOSPITAL_COMMUNITY)
Admission: RE | Admit: 2013-01-09 | Discharge: 2013-01-09 | Disposition: A | Discharge status: Home / Self Care | Payer: BC Managed Care – PPO | Source: Ambulatory Visit | Attending: Cardiovascular Disease | Admitting: Cardiovascular Disease

## 2013-01-11 ENCOUNTER — Encounter (HOSPITAL_COMMUNITY)
Admission: RE | Admit: 2013-01-11 | Discharge: 2013-01-11 | Disposition: A | Discharge status: Home / Self Care | Payer: BC Managed Care – PPO | Source: Ambulatory Visit | Attending: Cardiovascular Disease | Admitting: Cardiovascular Disease

## 2013-01-13 ENCOUNTER — Encounter (HOSPITAL_COMMUNITY)
Admission: RE | Admit: 2013-01-13 | Discharge: 2013-01-13 | Disposition: A | Discharge status: Home / Self Care | Payer: BC Managed Care – PPO | Source: Ambulatory Visit | Attending: Cardiovascular Disease | Admitting: Cardiovascular Disease

## 2013-01-16 ENCOUNTER — Other Ambulatory Visit (HOSPITAL_COMMUNITY): Payer: Self-pay | Admitting: Cardiology

## 2013-01-18 ENCOUNTER — Encounter (HOSPITAL_COMMUNITY): Payer: BC Managed Care – PPO

## 2013-01-20 ENCOUNTER — Encounter (HOSPITAL_COMMUNITY)
Admission: RE | Admit: 2013-01-20 | Discharge: 2013-01-20 | Disposition: A | Discharge status: Home / Self Care | Payer: BC Managed Care – PPO | Source: Ambulatory Visit | Attending: Cardiovascular Disease | Admitting: Cardiovascular Disease

## 2013-01-23 ENCOUNTER — Encounter (HOSPITAL_COMMUNITY)
Admission: RE | Admit: 2013-01-23 | Discharge: 2013-01-23 | Disposition: A | Discharge status: Home / Self Care | Payer: BC Managed Care – PPO | Source: Ambulatory Visit | Attending: Cardiovascular Disease | Admitting: Cardiovascular Disease

## 2013-01-25 ENCOUNTER — Encounter (HOSPITAL_COMMUNITY)
Admission: RE | Admit: 2013-01-25 | Discharge: 2013-01-25 | Disposition: A | Discharge status: Home / Self Care | Payer: BC Managed Care – PPO | Source: Ambulatory Visit | Attending: Cardiovascular Disease | Admitting: Cardiovascular Disease

## 2013-01-27 ENCOUNTER — Encounter (HOSPITAL_COMMUNITY): Payer: BC Managed Care – PPO

## 2013-03-22 ENCOUNTER — Encounter: Payer: Self-pay | Admitting: Cardiovascular Disease

## 2013-03-22 ENCOUNTER — Ambulatory Visit (INDEPENDENT_AMBULATORY_CARE_PROVIDER_SITE_OTHER): Payer: BC Managed Care – PPO | Admitting: Cardiovascular Disease

## 2013-03-22 VITALS — BP 118/79 | HR 54 | Ht 71.0 in | Wt 246.0 lb

## 2013-03-22 DIAGNOSIS — J449 Chronic obstructive pulmonary disease, unspecified: Secondary | ICD-10-CM

## 2013-03-22 DIAGNOSIS — N529 Male erectile dysfunction, unspecified: Secondary | ICD-10-CM

## 2013-03-22 DIAGNOSIS — E785 Hyperlipidemia, unspecified: Secondary | ICD-10-CM

## 2013-03-22 DIAGNOSIS — I2510 Atherosclerotic heart disease of native coronary artery without angina pectoris: Secondary | ICD-10-CM

## 2013-03-22 DIAGNOSIS — I7090 Unspecified atherosclerosis: Secondary | ICD-10-CM

## 2013-03-22 MED ORDER — METOPROLOL SUCCINATE ER 25 MG PO TB24: 25.0000 mg | ORAL_TABLET | Freq: Every day | ORAL | Status: DC

## 2013-03-22 NOTE — Progress Notes (Signed)
Patient ID: Sean Gomez, male   DOB: 1950/09/22, 64 y.o.   MRN: [id number]      SUBJECTIVE: The patient is here for routine cardiovascular followup. This is my first time meeting him. He has a history of coronary artery disease with a prior history of non-STEMI with a previously placed stent in the right coronary artery. He also has a history of mild COPD and a prior history of tobacco abuse. A stress echocardiogram on 09/30/2012 was normal, with a resting LVEF of 60%. He has been doing well and has only had to use one sublingual nitroglycerin in the past 6 months. He denies significant shortness of breath, leg swelling, palpitations, lightheadedness, dizziness, orthopnea and paroxysmal nocturnal dyspnea. He completed cardiac rehabilitation. He joined a gym in [place] and is trying to lose weight. He is considering going on the [employer]. He's had some issues with prostatitis and erectile dysfunction and is wondering if he is able to take Viagra. He also wonders if the metoprolol is causing some of the side effects.    Allergies  Allergen Reactions  . Atorvastatin Other (See Comments)    myalgias  . Sulfa Antibiotics   . Penicillins Rash    Current Outpatient Prescriptions  Medication Sig Dispense Refill  . aspirin 81 MG tablet Take 81 mg by mouth daily.      . metoprolol tartrate (LOPRESSOR) 25 MG tablet TAKE 1 TABLET TWICE A DAY  60 tablet  5  . NITROSTAT 0.4 MG SL tablet PLACE 1 TABLET UNDER TONGUE EVERY 5 MINUTES FOR 3 DOSES AS NEEDED FOR CHEST PAIN  25 tablet  4  . pravastatin (PRAVACHOL) 80 MG tablet Take 80 mg by mouth daily.       No current facility-administered medications for this visit.    Past Medical History  Diagnosis Date  . Bradycardia   . Heart murmur   . Hyperlipidemia   . Arteriosclerotic cardiovascular disease (ASCVD) 08/2011    NSTEMI 7/13: LHC with mRCA occluded tx with Xience DES to mRCA; minimal disease in CFX/LAD; EF of 45% with inferior  hypokinesis; low blood pressure  . Tobacco abuse     60 pack years; 1.5 pack per day  . Myocardial infarct     Past Surgical History  Procedure Laterality Date  . Appendectomy    . Coronary stent placement      History   Social History  . Marital Status: Married    Spouse Name: N/A    Number of Children: N/A  . Years of Education: N/A   Occupational History  . 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 Smoking status: Former Smoker -- 2.00 packs/day for 40 years    Types: Cigarettes    Quit date: 08/18/2012  . Smokeless tobacco: Not on file  . Alcohol Use: No  . Drug Use: No  . Sexual Activity: Not on file   Other Topics Concern  . Not on file   Social History Narrative  . No narrative on file     Filed Vitals:   03/22/13 0815  BP: 118/79  Pulse: 54  Height: 5\' 11"  (1.803 m)  Weight: 246 lb (111.585 kg)    PHYSICAL EXAM General: NAD Neck: No JVD, no thyromegaly or thyroid nodule.  Lungs: Clear to auscultation bilaterally with normal respiratory effort. CV: Nondisplaced PMI.  Heart regular S1/S2, no S3/S4, no murmur.  No peripheral edema.  No carotid bruit.  Normal  pedal pulses.  Abdomen: Soft, nontender, no hepatosplenomegaly, no distention.  Neurologic: Alert and oriented x 3.  Psych: Normal affect. Extremities: No clubbing or cyanosis.   ECG: reviewed and available in electronic records.      ASSESSMENT AND PLAN: 1. CAD: symptomatically stable. Due to issues with erectile dysfunction, I will switch metoprolol tartrate 25 mg bid to Toprol-XL 25 mg daily to see if this helps to alleviate his symptoms. Continue ASA and statin. Normal stress echo on 09/30/2012. 2. Hyperlipidemia: continue pravastatin 80 mg daily. Will check to see if a recent lipid panel was completed. 3. Erectile dysfunction: medication adjustments as mentioned above with switch to long-acting metoprolol.  Dispo: f/u 6 months.   Kate Sable, M.D., F.A.C.C.

## 2013-03-22 NOTE — Patient Instructions (Signed)
Your physician wants you to follow-up in: 6 months You will receive a reminder letter in the mail two months in advance. If you don't receive a letter, please call our office to schedule the follow-up appointment.  Your physician has recommended you make the following change in your medication:   1) COMPLETE CURRENT PRESCRIPTION OF METOPROLOL BY TAKING HALF OF THE 25MG  TABLET (12.5MG ) TWICE DAILY  2) ONCE METOPROLOL IS COMPLETED START TOPROL XL 25MG  ONCE DAILY

## 2013-04-04 NOTE — Addendum Note (Signed)
Encounter addended by: Norlene Duel, RN on: 04/04/2013 12:28 PM<BR>     Documentation filed: Notes Section

## 2013-04-04 NOTE — Addendum Note (Signed)
Encounter addended by: Norlene Duel, RN on: 04/04/2013 12:29 PM<BR>     Documentation filed: Clinical Notes

## 2013-04-04 NOTE — Progress Notes (Signed)
Cardiac Rehabilitation Program Outcomes Report   Orientation:  10/19/2012 1st week report: 10/28/2012 Graduate Date:  tbd Discharge Date:  tbd # of sessions completed: 3 DX: ASCVD and Stent X 1  Cardiologist: Bronson Ing Family MD:  Little Class Time:  09:30  A.  Exercise Program:  Tolerates exercise @ 3.89 METS for 15 minutes and Walk Test Results:  Pre: Pre walk Test: Resting HR 50, BP 90/62, O2 97%, RPE 6 and RPD 6, 6 minuteHR 92, BP 154/60, O2 96%, RPE 11 and RPD 13, Post HR 53 BP 124/60 O2 98% RPE9 and RPD 9 Walked 162feet at 3.56mph at 3.3 METS.  B.  Mental Health:  Good mental attitude  C.  Education/Instruction/Skills  Accurately checks own pulse.  Rest:  64  Exercise:  102, Knows THR for exercise and Uses Perceived Exertion Scale and/or Dyspnea Scale  Uses Perceived Exertion Scale and/or Dyspnea Scale  D.  Nutrition/Weight Control/Body Composition:  Adherence to prescribed nutrition program: good    E.  Blood Lipids    Lab Results  Component Value Date   CHOL 161 12/29/2011   HDL 28* 12/29/2011   LDLCALC 79 12/29/2011   TRIG 269* 12/29/2011   CHOLHDL 5.8 12/29/2011    F.  Lifestyle Changes:  Making positive lifestyle changes and Not smoking:  Quit 2014  G.  Symptoms noted with exercise:  Asymptomatic  Report Completed By:  Oletta Lamas. Rhegan Trunnell RN   Comments: This is patients 1st week report, He is doing well. He achieved a peak METS of 3.89. His resting HR was 64 and his resting BP was 118/70., and his peak BP was 102 and peak BP was 142/62. A halfway report will follow upon his 18 th visit

## 2013-04-10 NOTE — Progress Notes (Signed)
Cardiac Rehabilitation Program Outcomes Report   Orientation:  10/19/2012 Halfway report: 12/07/2012 Graduate Date:  tbd Discharge Date:  tbd # of sessions completed: 18 DX: ASCVD and Stent X 1   Cardiologist: Bronson Ing Family MD:  Little Class Time:  09:30  A.  Exercise Program:  Tolerates exercise @ 3.89 METS for 15 minutes  B.  Mental Health:  Good mental attitude  C.  Education/Instruction/Skills  Accurately checks own pulse.  Rest:  77  Exercise: 114, Knows THR for exercise and Uses Perceived Exertion Scale and/or Dyspnea Scale  Uses Perceived Exertion Scale and/or Dyspnea Scale  D.  Nutrition/Weight Control/Body Composition:  Adherence to prescribed nutrition program: good    E.  Blood Lipids    Lab Results  Component Value Date   CHOL 161 12/29/2011   HDL 28* 12/29/2011   LDLCALC 79 12/29/2011   TRIG 269* 12/29/2011   CHOLHDL 5.8 12/29/2011    F.  Lifestyle Changes:  Making positive lifestyle changes and Not smoking:  Quit 2014  G.  Symptoms noted with exercise:  Asymptomatic  Report Completed By:  Oletta Lamas. Dicie Edelen RN   Comments:  This is patients halfway report. He has done well in rehab. He achieved a peak METS of 3.89. His resting HR was 77 and BP 118/69.,and  His peak HR was 114 and peak BP 140/80. A report will follow upon his graduation at 36 sessions.

## 2013-04-10 NOTE — Addendum Note (Signed)
Encounter addended by: Norlene Duel, RN on: 04/10/2013  7:38 AM<BR>     Documentation filed: Clinical Notes

## 2013-04-10 NOTE — Addendum Note (Signed)
Encounter addended by: Norlene Duel, RN on: 04/10/2013  7:37 AM<BR>     Documentation filed: Notes Section

## 2013-04-10 NOTE — Addendum Note (Signed)
Encounter addended by: Norlene Duel, RN on: 04/10/2013  7:15 AM<BR>     Documentation filed: Notes Section

## 2013-04-10 NOTE — Addendum Note (Signed)
Encounter addended by: Norlene Duel, RN on: 04/10/2013  7:17 AM<BR>     Documentation filed: Clinical Notes

## 2013-04-10 NOTE — Progress Notes (Signed)
Cardiac Rehabilitation Program Outcomes Report   Orientation:  10/19/2012 Graduate Date:  01/25/2013 Discharge Date:  01/25/2013 # of sessions completed: 36 DX: ASCVD and Stent X 1   Cardiologist: Bronson Ing Family MD:  Hulan Fess Class Time:  09:30  A.  Exercise Program:  Tolerates exercise @ 4.00 METS for 15 minutes, Walk Test Results:  Post: Post Walk Test: Resting HR 51, BP 100/60, O2 98%, RPE 9,and RPD 9, 6 min HR 128, BP 150/78 O2 95%, RPE 15 and RPD 13, Post HR 73, BP 130/70, 02 99%, RPE 9 and RPD 9. and Discharged to home exercise program.  Anticipated compliance:  excellent  B.  Mental Health:  Good mental attitude  C.  Education/Instruction/Skills  Accurately checks own pulse.  Rest:  51  Exercise:  114, Knows THR for exercise, Uses Perceived Exertion Scale and/or Dyspnea Scale and Attended 13 education classes  Uses Perceived Exertion Scale and/or Dyspnea Scale  D.  Nutrition/Weight Control/Body Composition:  Adherence to prescribed nutrition program: good    E.  Blood Lipids    Lab Results  Component Value Date   CHOL 161 12/29/2011   HDL 28* 12/29/2011   LDLCALC 79 12/29/2011   TRIG 269* 12/29/2011   CHOLHDL 5.8 12/29/2011    F.  Lifestyle Changes:  Making positive lifestyle changes and Not smoking:  Quit 2014  G.  Symptoms noted with exercise:  Asymptomatic  Report Completed By:  Oletta Lamas. Aliyah Abeyta RN   Comments:  THis is patients graduation Report. He has done well in rehab he completed the 36 sessions. His resting HR was 51 and resting BP was 110/60 and peak HR was 114 and peak BP was 120/80. A call will be made to patient at 1 month , 6 month and 1 year to ensure compliance with exercise.

## 2013-09-19 ENCOUNTER — Telehealth: Payer: Self-pay | Admitting: *Deleted

## 2013-09-19 DIAGNOSIS — J449 Chronic obstructive pulmonary disease, unspecified: Secondary | ICD-10-CM

## 2013-09-19 DIAGNOSIS — R918 Other nonspecific abnormal finding of lung field: Secondary | ICD-10-CM

## 2013-09-19 NOTE — Telephone Encounter (Signed)
Message copied by Rosana Berger on Tue Sep 19, 2013  2:00 PM ------      Message from: Tanda Rockers      Created: Tue Dec 13, 2012  4:05 PM       Needs ov with pft's  ------

## 2013-09-19 NOTE — Telephone Encounter (Signed)
The above is the AVS from last ov 12/13/12    Congratulations on not smoking - it's the most important aspect of your care  You will need a follow up CT chest and set of PFT's in 09/2013 - we will place tickle file for recall           I spoke with the pt and reminded him of the above recs and he verbalized understanding  Orders were sent to [facility]

## 2013-09-26 ENCOUNTER — Encounter: Payer: Self-pay | Admitting: Cardiovascular Disease

## 2013-09-26 ENCOUNTER — Ambulatory Visit (INDEPENDENT_AMBULATORY_CARE_PROVIDER_SITE_OTHER): Payer: BC Managed Care – PPO | Admitting: Cardiovascular Disease

## 2013-09-26 VITALS — BP 120/72 | HR 65 | Ht 71.0 in | Wt 240.0 lb

## 2013-09-26 DIAGNOSIS — E782 Mixed hyperlipidemia: Secondary | ICD-10-CM

## 2013-09-26 DIAGNOSIS — I2510 Atherosclerotic heart disease of native coronary artery without angina pectoris: Secondary | ICD-10-CM

## 2013-09-26 DIAGNOSIS — Z720 Tobacco use: Secondary | ICD-10-CM

## 2013-09-26 DIAGNOSIS — I7090 Unspecified atherosclerosis: Secondary | ICD-10-CM

## 2013-09-26 DIAGNOSIS — R9389 Abnormal findings on diagnostic imaging of other specified body structures: Secondary | ICD-10-CM

## 2013-09-26 DIAGNOSIS — F17200 Nicotine dependence, unspecified, uncomplicated: Secondary | ICD-10-CM

## 2013-09-26 NOTE — Patient Instructions (Signed)
Your physician wants you to follow-up in: 6 months with Dr. Bronson Ing. You will receive a reminder letter in the mail two months in advance. If you don't receive a letter, please call our office to schedule the follow-up appointment.  Your physician recommends that you continue on your current medications as directed. Please refer to the Current Medication list given to you today.  Your physician recommends that you return for lab work to check your cholesterol   Thank you for choosing [facility]!!

## 2013-09-26 NOTE — Progress Notes (Signed)
Patient ID: Sean Gomez, male   DOB: 01/15/1951, 63 y.o.   MRN: [id number]      SUBJECTIVE: The patient is here for routine cardiovascular followup. He has a history of coronary artery disease with a prior history of non-STEMI with a previously placed stent in the right coronary artery. He also has a history of mild COPD and a prior history of tobacco abuse. A stress echocardiogram on 09/30/2012 was normal, with a resting LVEF of 60%. Several months ago he had been experiencing chest pain only on Friday nights when he got together with friends to play games. He realized it was due to some component of the sauce in [employer]. When he switched to [facility], his symptoms resolved. He also noticed he broke out in sweats during these episodes.  He may occasionally experience dyspnea with strenuous exertion, and only when performed outdoors in high temperatures. He denies dizziness, orthopnea, paroxysmal nocturnal dyspnea, leg swelling and syncope.  There is apparently an abnormality seen on a chest x-ray several months ago and he is due to have primary function testing and a repeat image of his chest done in the near future. This is being followed up by his primary care physician. He was previously intolerant to both Crestor and Lipitor and is taking pravastatin and fenofibrate without difficulty. Unfortunately, he has started smoking again.    Allergies  Allergen Reactions  . Atorvastatin Other (See Comments)    myalgias  . Sulfa Antibiotics   . Penicillins Rash    Current Outpatient Prescriptions  Medication Sig Dispense Refill  . aspirin 81 MG tablet Take 81 mg by mouth daily.      . fenofibrate 160 MG tablet Take 160 mg by mouth daily.      . metoprolol succinate (TOPROL-XL) 25 MG 24 hr tablet Take 1 tablet (25 mg total) by mouth daily.  90 tablet  1  . NITROSTAT 0.4 MG SL tablet PLACE 1 TABLET UNDER TONGUE EVERY 5 MINUTES FOR 3 DOSES AS NEEDED FOR CHEST PAIN  25 tablet  4    . pravastatin (PRAVACHOL) 80 MG tablet Take 80 mg by mouth daily.       No current facility-administered medications for this visit.    Past Medical History  Diagnosis Date  . Bradycardia   . Heart murmur   . Hyperlipidemia   . 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 Tobacco abuse     60 pack years; 1.5 pack per day  . Myocardial infarct     Past Surgical History  Procedure Laterality Date  . Appendectomy    . Coronary stent placement      History   Social History  . Marital Status: Married    Spouse Name: N/A    Number of Children: N/A  . Years of Education: N/A   Occupational History  . 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 Smoking status: Current Every Day Smoker -- 2.00 packs/day for 40 years    Types: Cigarettes    Last Attempt to Quit: 08/18/2012  . Smokeless tobacco: Not on file  . Alcohol Use: No  . Drug Use: No  . Sexual Activity: Not on file   Other Topics Concern  . Not on file   Social History  Narrative  . No narrative on file     Filed Vitals:   09/26/13 0945  BP: 120/72  Pulse: 65  Height: 5\' 11"  (1.803 m)  Weight: 240 lb (108.863 kg)    PHYSICAL EXAM General: NAD Neck: No JVD, no thyromegaly. Lungs: Clear to auscultation bilaterally with normal respiratory effort. CV: Nondisplaced PMI.  Regular rate and rhythm, normal S1/S2, no S3/S4, no murmur. No pretibial or periankle edema.  No carotid bruit.  Normal pedal pulses.  Abdomen: Soft, nontender, no hepatosplenomegaly, no distention.  Neurologic: Alert and oriented x 3.  Psych: Normal affect. Extremities: No clubbing or cyanosis.   ECG: reviewed and available in electronic records.      ASSESSMENT AND PLAN: 1. CAD: Symptomatically stable. Continue ASA, metoprolol and  statin. Normal stress echo on 09/30/2012.   2. Hyperlipidemia: Continue pravastatin 80 mg daily. Due to have lipid panel and LFT's checked by PCP this week. Will obtain results.  3. Tobacco cessation counseling provided.  Dispo: f/u 6 months.  Kate Sable, M.D., F.A.C.C.

## 2013-10-06 ENCOUNTER — Other Ambulatory Visit: Payer: Self-pay

## 2013-10-06 ENCOUNTER — Telehealth: Payer: Self-pay | Admitting: Cardiovascular Disease

## 2013-10-06 MED ORDER — METOPROLOL SUCCINATE ER 25 MG PO TB24: 25.0000 mg | ORAL_TABLET | Freq: Every day | ORAL | Status: DC

## 2013-10-06 NOTE — Telephone Encounter (Signed)
Needs refill on Metoprolol sent to [facility]. / tgs

## 2013-10-06 NOTE — Telephone Encounter (Signed)
Refill request complete 

## 2013-10-09 ENCOUNTER — Encounter: Payer: Self-pay | Admitting: Cardiovascular Disease

## 2013-10-12 ENCOUNTER — Encounter: Payer: Self-pay | Admitting: Internal Medicine

## 2013-10-12 ENCOUNTER — Ambulatory Visit (HOSPITAL_COMMUNITY)
Admission: RE | Admit: 2013-10-12 | Discharge: 2013-10-12 | Disposition: A | Discharge status: Home / Self Care | Payer: BC Managed Care – PPO | Source: Ambulatory Visit | Attending: Internal Medicine | Admitting: Internal Medicine

## 2013-10-12 DIAGNOSIS — J4489 Other specified chronic obstructive pulmonary disease: Secondary | ICD-10-CM | POA: Insufficient documentation

## 2013-10-12 DIAGNOSIS — R918 Other nonspecific abnormal finding of lung field: Secondary | ICD-10-CM

## 2013-10-12 DIAGNOSIS — J449 Chronic obstructive pulmonary disease, unspecified: Secondary | ICD-10-CM | POA: Insufficient documentation

## 2013-10-12 MED ORDER — ALBUTEROL SULFATE (2.5 MG/3ML) 0.083% IN NEBU
2.5000 mg | INHALATION_SOLUTION | Freq: Once | RESPIRATORY_TRACT | Status: AC
Start: 2013-10-12 — End: 2013-10-12
  Administered 2013-10-12: 2.5 mg via RESPIRATORY_TRACT

## 2013-10-12 NOTE — Progress Notes (Signed)
Quick Note:  Spoke with pt and notified of results per Dr. Wert. Pt verbalized understanding and denied any questions.  ______ 

## 2013-10-15 LAB — PULMONARY FUNCTION TEST
DL/VA % PRED: 82 %
DL/VA: 3.86 ml/min/mmHg/L
DLCO unc % pred: 76 %
DLCO unc: 25.78 ml/min/mmHg
FEF 25-75 POST: 3.34 L/s
FEF 25-75 PRE: 2.94 L/s
FEF2575-%CHANGE-POST: 13 %
FEF2575-%PRED-POST: 113 %
FEF2575-%Pred-Pre: 99 %
FEV1-%CHANGE-POST: 1 %
FEV1-%PRED-POST: 98 %
FEV1-%PRED-PRE: 96 %
FEV1-PRE: 3.51 L
FEV1-Post: 3.58 L
FEV1FVC-%Change-Post: 3 %
FEV1FVC-%PRED-PRE: 103 %
FEV6-%Change-Post: 0 %
FEV6-%PRED-POST: 95 %
FEV6-%Pred-Pre: 96 %
FEV6-Post: 4.43 L
FEV6-Pre: 4.46 L
FEV6FVC-%Change-Post: 0 %
FEV6FVC-%PRED-PRE: 103 %
FEV6FVC-%Pred-Post: 104 %
FVC-%Change-Post: -1 %
FVC-%Pred-Post: 92 %
FVC-%Pred-Pre: 93 %
FVC-Post: 4.48 L
FVC-Pre: 4.54 L
POST FEV1/FVC RATIO: 80 %
PRE FEV6/FVC RATIO: 98 %
Post FEV6/FVC ratio: 99 %
Pre FEV1/FVC ratio: 77 %
RV % pred: 96 %
RV: 2.26 L
TLC % pred: 92 %
TLC: 6.69 L

## 2013-10-16 ENCOUNTER — Encounter (HOSPITAL_COMMUNITY): Payer: BC Managed Care – PPO

## 2013-10-26 ENCOUNTER — Ambulatory Visit (INDEPENDENT_AMBULATORY_CARE_PROVIDER_SITE_OTHER): Payer: BC Managed Care – PPO | Admitting: Internal Medicine

## 2013-10-26 ENCOUNTER — Encounter: Payer: Self-pay | Admitting: Internal Medicine

## 2013-10-26 VITALS — BP 132/64 | HR 62 | Temp 97.9°F | Ht 71.0 in | Wt 247.8 lb

## 2013-10-26 DIAGNOSIS — J449 Chronic obstructive pulmonary disease, unspecified: Secondary | ICD-10-CM

## 2013-10-26 DIAGNOSIS — F17200 Nicotine dependence, unspecified, uncomplicated: Secondary | ICD-10-CM

## 2013-10-26 DIAGNOSIS — R918 Other nonspecific abnormal finding of lung field: Secondary | ICD-10-CM

## 2013-10-26 DIAGNOSIS — F17210 Nicotine dependence, cigarettes, uncomplicated: Secondary | ICD-10-CM

## 2013-10-26 NOTE — Patient Instructions (Addendum)
Best choice for dripping nose is zyrtec at bedtime (24h)  - clariton causes less drowsiness but is less effective  The key is to stop smoking completely before smoking completely stops you!

## 2013-10-26 NOTE — Assessment & Plan Note (Signed)
See copd gold 0 - at risk if keeps smoking > advised

## 2013-10-26 NOTE — Progress Notes (Signed)
Subjective:    Patient ID: Sean Gomez, male    DOB: 08-11-1950  MRN: [id number]    Brief patient profile:  56  yowm quit smoking July 2015  with cough with nasal congestion/ drainage year round but better with stop smoking and also seasonal rhinitis/ cough spring > fall   referred 12/13/2012 by Dr Hulan Fess for eval of abn ct chest first ordered by  Dr Lattie Haw.   History of Present Illness  12/13/2012 1st [facility] office visit/EPIC era Wert cc doe x heavy lifting but able to do fine with rehab s cp or sob ""they tell me to slow down due to my heart, not because I'm winded" rec  You will need a follow up CT chest and set of PFT's in 09/2013  - we will place tickle file for recall    10/26/2013 f/u ov/Wert re: mpns/pnds Chief Complaint  Patient presents with  . Follow-up    Pt states that he is doing well with his breathing and denies any co's today. He is here to review PFT and CT Chest.   severity of pnds and cough variable according to season or whether/ how much smoking - occ clariton not very helpful when severe. No purulent secretions/ wheeze/ sob/need for inhalers   No other day to day or daytime variabilty or assoc  cp or chest tightness, subjective wheeze overt   hb symptoms. No unusual exp hx or h/o childhood pna/ asthma or knowledge of premature birth.  Sleeping ok without nocturnal  or early am exacerbation  of respiratory  c/o's or need for noct saba. Also denies any obvious fluctuation of symptoms with weather or environmental changes or other aggravating or alleviating factors except as outlined above   Current Medications, Allergies, Complete Past Medical History, Past Surgical History, Family History, and Social History were reviewed in [employer] record.  ROS  The following are not active complaints unless bolded sore throat, dysphagia, dental problems, itching, sneezing,  nasal congestion or excess/ purulent secretions, ear  ache,   fever, chills, sweats, unintended wt loss, pleuritic or exertional cp, hemoptysis,  orthopnea pnd or leg swelling, presyncope, palpitations, heartburn, abdominal pain, anorexia, nausea, vomiting, diarrhea  or change in bowel or urinary habits, change in stools or urine, dysuria,hematuria,  rash, arthralgias, visual complaints, headache, numbness weakness or ataxia or problems with walking or coordination,  change in mood/affect or memory.                          Objective:   Physical Exam   amb mod obese wm nad  10/26/2013        248  Wt Readings from Last 3 Encounters:  12/13/12 248 lb (112.492 kg)  10/19/12 244 lb 6.4 oz (110.859 kg)  09/19/12 237 lb 12 oz (107.843 kg)      HEENT: nl dentition, turbinates, and orophanx. Nl external ear canals without cough reflex   NECK :  without JVD/Nodes/TM/ nl carotid upstrokes bilaterally   LUNGS: no acc muscle use, clear to A and P bilaterally without cough on insp or exp maneuvers   CV:  RRR  no s3 or murmur or increase in P2, no edema   ABD:  soft and nontender with nl excursion in the supine position. No bruits or organomegaly, bowel sounds nl  MS:  warm without deformities, calf tenderness, cyanosis or clubbing  SKIN: warm and dry without lesions    NEURO:  alert, approp, no deficits      ct chest 10/12/13 much better, min tree in bud (non-specific)       Assessment & Plan:

## 2013-10-26 NOTE — Assessment & Plan Note (Signed)
-   PFT'sw nl   10/12/13   > 3 min discussion I reviewed the Flethcher curve with patient that basically indicates  if you quit smoking when your best day FEV1 is still well preserved (as is the clearly the case here)  it is highly unlikely you will progress to severe disease and informed the patient there was no medication on the market that has proven to change the curve or the likelihood of progression.  Therefore stopping smoking and maintaining abstinence is the most important aspect of care, not choice of inhalers or for that matter, doctors.    Pulmonary f/u is prn

## 2013-10-26 NOTE — Assessment & Plan Note (Signed)
5/2014[facility] nodular density over the right upper lobe.  09/2012:CT-upper lung zone predominant peribronchovascular nodularity, ?sarcoid, post infectious scarring; largest nodule-4 mm. + risk factors for carcinoma, follow-up CT at 1 year is  Recommended.  Copy sent to Dr. Lawernce Pitts for further evaluation and Rx. See CT 10/13/12  1. Pattern of upper lung zone predominant peribronchovascular  nodularity and mild architectural distortion, with suspected  involvement of the left major fissure. Findings can be seen with  sarcoid. Post infectious scarring is another consideration.  2. Largest individual nodule measures 4 mm. - CT 10/12/2013 improved so no f/u needed

## 2014-02-08 ENCOUNTER — Encounter (HOSPITAL_COMMUNITY): Payer: Self-pay | Admitting: Cardiovascular Disease

## 2014-02-16 ENCOUNTER — Ambulatory Visit: Payer: BC Managed Care – PPO | Admitting: Adult Health

## 2014-02-18 NOTE — Progress Notes (Signed)
HPI: Sean Gomez is a 63 year old male patient of Dr. Bronson Ing with known history of CAD, prior history of non-ST elevated MI with stent to the right coronary artery, COPD, and tobacco abuse.   The patient had a stress echocardiogram in August 2014 which was normal with LVEF of 60%. He was last seen by Dr. Bronson Ing in July 2015. At that office visit. He was symptomatically stable. He was continued on current medication regimen, and advised to quit smoking again. He is here for 6 months follow-up.  He was seen at [facility] ER this weekend for recurrent chest pain and indigestion similar to prior MI. He was offered admission and follow up stress test but refused. He felt better. He is concerned about recurrence of symptoms. He states that it usually occurs over the weekend after eating pizza on Friday nights. He changed brand to [facility] as he thought the sauce may be causing the heartburn. He has not been seen by GI in the past. Takes Gaviscon for symptomatic relief. He quit smoking one week ago. States he keeps trying but goes back to it.     Allergies  Allergen Reactions  . Atorvastatin Other (See Comments)    myalgias  . Sulfa Antibiotics   . Penicillins Rash    Current Outpatient Prescriptions  Medication Sig Dispense Refill  . aspirin 81 MG tablet Take 81 mg by mouth daily.    . fenofibrate 160 MG tablet Take 160 mg by mouth daily.    . metoprolol succinate (TOPROL-XL) 25 MG 24 hr tablet Take 1 tablet (25 mg total) by mouth daily. 90 tablet 3  . NITROSTAT 0.4 MG SL tablet PLACE 1 TABLET UNDER TONGUE EVERY 5 MINUTES FOR 3 DOSES AS NEEDED FOR CHEST PAIN 25 tablet 4  . Omega-3 Fatty Acids (FISH OIL) 1200 MG CAPS Take 1,200 mg by mouth 4 (four) times daily.    . pravastatin (PRAVACHOL) 80 MG tablet Take 80 mg by mouth daily.     No current facility-administered medications for this visit.    Past Medical History  Diagnosis Date  . Bradycardia   . Heart murmur   .  Hyperlipidemia   . 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 Tobacco abuse     60 pack years; 1.5 pack per day  . Myocardial infarct     Past Surgical History  Procedure Laterality Date  . Appendectomy    . Coronary stent placement    . Left heart catheterization with coronary angiogram N/A 09/11/2011    Procedure: LEFT HEART CATHETERIZATION WITH CORONARY ANGIOGRAM;  Surgeon: Sherren Mocha, MD;  Location: [facility] CATH LAB;  Service: Cardiovascular;  Laterality: N/A;  . Percutaneous coronary stent intervention (pci-s) Right 09/11/2011    Procedure: PERCUTANEOUS CORONARY STENT INTERVENTION (PCI-S);  Surgeon: Sherren Mocha, MD;  Location: [facility] CATH LAB;  Service: Cardiovascular;  Laterality: Right;    ROS: Complete review of systems performed and found to be negative unless outlined above  PHYSICAL EXAM BP 104/64 mmHg  Pulse 60  Ht 5\' 11"  (1.803 m)  Wt 245 lb (111.131 kg)  BMI 34.19 kg/m2  SpO2 97% General: Well developed, well nourished, in no acute distress, overweight Head: Eyes PERRLA, No xanthomas.   Normal cephalic and atramatic  Lungs: Clear bilaterally to auscultation and percussion. Heart: HRRR S1 S2, without  MRG.  Pulses are 2+ & equal.            No carotid bruit. No JVD.  No abdominal bruits. No femoral bruits. Abdomen: Bowel sounds are positive, abdomen soft and non-tender without masses or                  Hernia's noted. Msk:  Back normal, normal gait. Normal strength and tone for age. Extremities: No clubbing, cyanosis or edema.  DP +1 Neuro: Alert and oriented X 3. Psych:  Good affect, responds appropriately   ASSESSMENT AND PLAN

## 2014-02-19 ENCOUNTER — Ambulatory Visit (INDEPENDENT_AMBULATORY_CARE_PROVIDER_SITE_OTHER): Payer: BC Managed Care – PPO | Admitting: Adult Health

## 2014-02-19 ENCOUNTER — Encounter: Payer: Self-pay | Admitting: *Deleted

## 2014-02-19 ENCOUNTER — Encounter: Payer: Self-pay | Admitting: Adult Health

## 2014-02-19 VITALS — BP 104/64 | HR 60 | Ht 71.0 in | Wt 245.0 lb

## 2014-02-19 DIAGNOSIS — Z720 Tobacco use: Secondary | ICD-10-CM

## 2014-02-19 DIAGNOSIS — F17210 Nicotine dependence, cigarettes, uncomplicated: Secondary | ICD-10-CM

## 2014-02-19 DIAGNOSIS — E785 Hyperlipidemia, unspecified: Secondary | ICD-10-CM

## 2014-02-19 DIAGNOSIS — R079 Chest pain, unspecified: Secondary | ICD-10-CM

## 2014-02-19 DIAGNOSIS — I2510 Atherosclerotic heart disease of native coronary artery without angina pectoris: Secondary | ICD-10-CM

## 2014-02-19 NOTE — Assessment & Plan Note (Addendum)
Doubt cardiac etiology but has symptoms that remind him of prior MI. Last stress test was 2 years ago. He continued to smoke and eat cholesterol rich foods. I will repeat his NM stress test to rule out progression of CAD. Risk factor management, continue BB and ASA.  If negative, I have recommended GI work-up and possible GB ultrasound or HIDA scan as this usually occurs after eating pizza.

## 2014-02-19 NOTE — Assessment & Plan Note (Signed)
Will need to be rechecked. He states PCP does this but he is looking for another. I will order this to evaluate status.

## 2014-02-19 NOTE — Assessment & Plan Note (Signed)
He quit one week ago. States he has quit for as long as 7 years but often goes back to it. Especially since he retired. I have advised him not to restart again

## 2014-02-19 NOTE — Patient Instructions (Signed)
Your physician recommends that you schedule a follow-up appointment after your stress test  Your physician recommends that you continue on your current medications as directed. Please refer to the Current Medication list given to you today.  Your physician has requested that you have en exercise stress myoview. For further information please visit [email or web address]. Please follow instruction sheet, as given.  Thank you for choosing [facility]!    

## 2014-02-19 NOTE — Progress Notes (Deleted)
Name: Sean Gomez    DOB: Nov 07, 1950  Age: 63 y.o.  MR#: [id number]       PCP:  Gennette Pac, MD      Insurance: Payor: [place] / Plan: [employer] [place] PPO / Product Type: *No Product type* /   CC:    Chief Complaint  Patient presents with  . Coronary Artery Disease  . Hyperlipidemia    VS Filed Vitals:   02/19/14 1544  BP: 104/64  Pulse: 60  Height: 5\' 11"  (1.803 m)  Weight: 245 lb (111.131 kg)  SpO2: 97%    Weights Current Weight  02/19/14 245 lb (111.131 kg)  10/26/13 247 lb 12.8 oz (112.401 kg)  09/26/13 240 lb (108.863 kg)    Blood Pressure  BP Readings from Last 3 Encounters:  02/19/14 104/64  10/26/13 132/64  09/26/13 120/72     Admit date:  (Not on file) Last encounter with RMR:  Visit date not found   Allergy Atorvastatin; Sulfa antibiotics; and Penicillins  Current Outpatient Prescriptions  Medication Sig Dispense Refill  . aspirin 81 MG tablet Take 81 mg by mouth daily.    . fenofibrate 160 MG tablet Take 160 mg by mouth daily.    . metoprolol succinate (TOPROL-XL) 25 MG 24 hr tablet Take 1 tablet (25 mg total) by mouth daily. 90 tablet 3  . NITROSTAT 0.4 MG SL tablet PLACE 1 TABLET UNDER TONGUE EVERY 5 MINUTES FOR 3 DOSES AS NEEDED FOR CHEST PAIN 25 tablet 4  . Omega-3 Fatty Acids (FISH OIL) 1200 MG CAPS Take 1,200 mg by mouth 4 (four) times daily.    . pravastatin (PRAVACHOL) 80 MG tablet Take 80 mg by mouth daily.     No current facility-administered medications for this visit.    Discontinued Meds:   There are no discontinued medications.  Patient Active Problem List   Diagnosis Date Noted  . COPD GOLD 0  12/14/2012  . Multiple pulmonary nodules 09/28/2012  . Hx of diagnostic tests 12/31/2011  . Cigarette smoker   . Hyperlipidemia 09/12/2011  . Arteriosclerotic cardiovascular disease (ASCVD) 08/31/2011    LABS    Component Value Date/Time   NA 138 03/22/2012 1119   NA 138 09/22/2011 1338   NA 143 09/12/2011 0500   K 4.4 03/22/2012 1119   K 4.3 09/22/2011 1338   K 4.1 09/12/2011 0500   CL 103 03/22/2012 1119   CL 105 09/22/2011 1338   CL 110 09/12/2011 0500   CO2 27 03/22/2012 1119   CO2 26 09/22/2011 1338   CO2 25 09/12/2011 0500   GLUCOSE 102* 03/22/2012 1119   GLUCOSE 104* 09/22/2011 1338   GLUCOSE 105* 09/12/2011 0500   BUN 14 03/22/2012 1119   BUN 15 09/22/2011 1338   BUN 13 09/12/2011 0500   CREATININE 0.87 03/22/2012 1119   CREATININE 0.85 09/22/2011 1338   CREATININE 0.89 09/12/2011 0500   CALCIUM 9.3 03/22/2012 1119   CALCIUM 9.1 09/22/2011 1338   CALCIUM 8.7 09/12/2011 0500   GFRNONAA >90 03/22/2012 1119   GFRNONAA >90 09/12/2011 0500   GFRAA >90 03/22/2012 1119   GFRAA >90 09/12/2011 0500   CMP     Component Value Date/Time   NA 138 03/22/2012 1119   K 4.4 03/22/2012 1119   CL 103 03/22/2012 1119   CO2 27 03/22/2012 1119   GLUCOSE 102* 03/22/2012 1119   BUN 14 03/22/2012 1119   CREATININE 0.87 03/22/2012 1119   CREATININE 0.85 09/22/2011 1338  CALCIUM 9.3 03/22/2012 1119   PROT 6.8 03/22/2012 1119   ALBUMIN 3.8 03/22/2012 1119   AST 19 03/22/2012 1119   ALT 15 03/22/2012 1119   ALKPHOS 80 03/22/2012 1119   BILITOT 0.5 03/22/2012 1119   GFRNONAA >90 03/22/2012 1119   GFRAA >90 03/22/2012 1119       Component Value Date/Time   WBC 5.8 03/22/2012 1057   WBC 8.0 09/12/2011 0525   HGB 13.7 03/22/2012 1057   HGB 13.2 09/12/2011 0525   HCT 39.0 03/22/2012 1057   HCT 39.2 09/12/2011 0525   MCV 89.4 03/22/2012 1057   MCV 90.7 09/12/2011 0525    Lipid Panel     Component Value Date/Time   CHOL 161 12/29/2011 0840   TRIG 269* 12/29/2011 0840   HDL 28* 12/29/2011 0840   CHOLHDL 5.8 12/29/2011 0840   VLDL 54* 12/29/2011 0840   LDLCALC 79 12/29/2011 0840    ABG No results found for: PHART, PCO2ART, PO2ART, HCO3, TCO2, ACIDBASEDEF, O2SAT   Lab Results  Component Value Date   TSH 1.349 09/11/2011   BNP (last 3 results) No results for input(s): PROBNP in  the last 8760 hours. Cardiac Panel (last 3 results) No results for input(s): CKTOTAL, CKMB, TROPONINI, RELINDX in the last 72 hours.  Iron/TIBC/Ferritin/ %Sat No results found for: IRON, TIBC, FERRITIN, IRONPCTSAT   EKG Orders placed or performed in visit on 09/29/12  . EKG 12-Lead     Prior Assessment and Plan Problem List as of 02/19/2014      Cardiovascular and Mediastinum   Arteriosclerotic cardiovascular disease (ASCVD)   Last Assessment & Plan   09/19/2012 Office Visit Edited 09/23/2012  9:49 PM by Yehuda Savannah, MD    Symptoms are somewhat atypical, but similar to past spells with myocardial ischemia. There appears to be a significant component of anxiety, but we will reassess ischemic heart disease with a stress echocardiogram.      Respiratory   COPD GOLD 0    Last Assessment & Plan   10/26/2013 Office Visit Written 10/26/2013 10:32 AM by Tanda Rockers, MD    - PFT'sw nl   10/12/13   > 3 min discussion I reviewed the Flethcher curve with patient that basically indicates  if you quit smoking when your best day FEV1 is still well preserved (as is the clearly the case here)  it is highly unlikely you will progress to severe disease and informed the patient there was no medication on the market that has proven to change the curve or the likelihood of progression.  Therefore stopping smoking and maintaining abstinence is the most important aspect of care, not choice of inhalers or for that matter, doctors.    Pulmonary f/u is prn       Other   Hyperlipidemia   Last Assessment & Plan   12/31/2011 Office Visit Written 12/31/2011  3:50 PM by Yehuda Savannah, MD    Recent lipid profile is acceptable on high-dose pravastatin.  HDL and triglycerides remains suboptimal at 28 and 269 respectively, but do not mandate additional pharmacologic therapy.  I have recommended exercise and weight loss.    Hx of diagnostic tests   Cigarette smoker   Last Assessment & Plan   10/26/2013  Office Visit Written 10/26/2013 10:33 AM by Tanda Rockers, MD    See copd gold 0 - at risk if keeps smoking > advised     Multiple pulmonary nodules   Last Assessment & Plan  10/26/2013 Office Visit Written 10/26/2013 10:32 AM by Tanda Rockers, MD    06/2012: [facility] ED nodular density over the right upper lobe.  09/2012:CT-upper lung zone predominant peribronchovascular nodularity, ?sarcoid, post infectious scarring; largest nodule-4 mm. + risk factors for carcinoma, follow-up CT at 1 year is  Recommended.  Copy sent to Dr. Lawernce Pitts for further evaluation and Rx. See CT 10/13/12  1. Pattern of upper lung zone predominant peribronchovascular  nodularity and mild architectural distortion, with suspected  involvement of the left major fissure. Findings can be seen with  sarcoid. Post infectious scarring is another consideration.  2. Largest individual nodule measures 4 mm. - CT 10/12/2013 improved so no f/u needed           Imaging: No results found.

## 2014-02-20 ENCOUNTER — Other Ambulatory Visit: Payer: Self-pay

## 2014-02-20 DIAGNOSIS — E785 Hyperlipidemia, unspecified: Secondary | ICD-10-CM

## 2014-03-06 ENCOUNTER — Ambulatory Visit (HOSPITAL_COMMUNITY)
Admission: RE | Admit: 2014-03-06 | Discharge: 2014-03-06 | Disposition: A | Discharge status: Home / Self Care | Payer: BLUE CROSS/BLUE SHIELD | Source: Ambulatory Visit | Attending: Adult Health | Admitting: Adult Health

## 2014-03-06 ENCOUNTER — Encounter (HOSPITAL_COMMUNITY)
Admission: RE | Admit: 2014-03-06 | Discharge: 2014-03-06 | Disposition: A | Discharge status: Home / Self Care | Payer: BLUE CROSS/BLUE SHIELD | Source: Ambulatory Visit | Attending: Adult Health | Admitting: Adult Health

## 2014-03-06 ENCOUNTER — Encounter (HOSPITAL_COMMUNITY): Payer: Self-pay

## 2014-03-06 DIAGNOSIS — R079 Chest pain, unspecified: Secondary | ICD-10-CM | POA: Diagnosis not present

## 2014-03-06 DIAGNOSIS — R0602 Shortness of breath: Secondary | ICD-10-CM | POA: Insufficient documentation

## 2014-03-06 MED ORDER — TECHNETIUM TC 99M SESTAMIBI - CARDIOLITE
30.0000 | Freq: Once | INTRAVENOUS | Status: AC | PRN
  Administered 2014-03-06: 30 via INTRAVENOUS

## 2014-03-06 MED ORDER — TECHNETIUM TC 99M SESTAMIBI GENERIC - CARDIOLITE
10.0000 | Freq: Once | INTRAVENOUS | Status: AC | PRN
  Administered 2014-03-06: 10 via INTRAVENOUS

## 2014-03-06 MED ORDER — SODIUM CHLORIDE 0.9 % IJ SOLN
INTRAMUSCULAR | Status: AC
  Filled 2014-03-06: qty 10

## 2014-03-06 MED ORDER — REGADENOSON 0.4 MG/5ML IV SOLN
INTRAVENOUS | Status: AC
  Filled 2014-03-06: qty 5

## 2014-03-06 MED ORDER — SODIUM CHLORIDE 0.9 % IJ SOLN
INTRAMUSCULAR | Status: AC
  Filled 2014-03-06: qty 30

## 2014-03-06 MED ORDER — SODIUM CHLORIDE 0.9 % IJ SOLN
10.0000 mL | INTRAMUSCULAR | Status: DC | PRN
  Administered 2014-03-06: 10 mL via INTRAVENOUS
  Filled 2014-03-06: qty 10

## 2014-03-06 NOTE — Progress Notes (Signed)
Stress Lab Nurses Notes - Sean Gomez 03/06/2014 Reason for doing test: Chest Pain Type of test: Stress Myoview Cardiolite Nurse performing test: Benay Pillow RN Nuclear Medicine Tech: Thornton Papas Tech: none MD performing test: Valentina Shaggy NP Family MD: K LIttle Test explained and consent signed: Yes.   IV started: Saline lock started in radiology Symptoms: SOB Treatment/Intervention: None Reason test stopped: reached target HR After recovery IV was: Discontinued via X-ray tech and No redness or edema Patient to return to Nuc. Med at :1120 Patient discharged: To another Medical Facility [place] Patient's Condition upon discharge was: stable Comments: Symptoms resolved in recovery. Peak BP 226/67 HR 157, Recovery BP 124/61, HR 77 [name] M

## 2014-03-12 ENCOUNTER — Ambulatory Visit: Payer: BC Managed Care – PPO | Admitting: Adult Health

## 2014-06-11 ENCOUNTER — Other Ambulatory Visit: Payer: Self-pay

## 2014-06-11 MED ORDER — METOPROLOL SUCCINATE ER 25 MG PO TB24: 25.0000 mg | ORAL_TABLET | Freq: Every day | ORAL | Status: DC

## 2014-06-11 NOTE — Telephone Encounter (Signed)
escribed refill 

## 2015-05-28 ENCOUNTER — Other Ambulatory Visit: Payer: Self-pay | Admitting: Cardiovascular Disease

## 2015-07-24 ENCOUNTER — Ambulatory Visit (INDEPENDENT_AMBULATORY_CARE_PROVIDER_SITE_OTHER): Payer: BLUE CROSS/BLUE SHIELD | Admitting: Cardiovascular Disease

## 2015-07-24 ENCOUNTER — Encounter: Payer: Self-pay | Admitting: Cardiovascular Disease

## 2015-07-24 VITALS — BP 100/60 | HR 67 | Ht 67.0 in | Wt 207.0 lb

## 2015-07-24 DIAGNOSIS — K219 Gastro-esophageal reflux disease without esophagitis: Secondary | ICD-10-CM

## 2015-07-24 DIAGNOSIS — I25118 Atherosclerotic heart disease of native coronary artery with other forms of angina pectoris: Secondary | ICD-10-CM | POA: Diagnosis not present

## 2015-07-24 DIAGNOSIS — E785 Hyperlipidemia, unspecified: Secondary | ICD-10-CM

## 2015-07-24 MED ORDER — METOPROLOL SUCCINATE ER 25 MG PO TB24: 25.0000 mg | ORAL_TABLET | Freq: Every day | ORAL | Status: DC

## 2015-07-24 NOTE — Progress Notes (Signed)
Patient ID: Sean Gomez, male   DOB: January 25, 1951, 65 y.o.   MRN: [id number]      SUBJECTIVE: The patient is here for routine cardiovascular followup. He has a history of coronary artery disease with a prior history of non-STEMI with a previously placed stent in the right coronary artery. He also has a history of mild COPD and a prior history of tobacco abuse.   Nuclear stress test 03/06/14 showed no evidence of reversible ischemia or infarction. Left ventricular wall motion was normal, LVEF 64%. It was deemed a low risk study.  ECG performed in the office today which I personally interpreted demonstrated sinus rhythm with an incomplete right bundle-branch block and left anterior fascicular block.  He denies any significant exertional chest pain and shortness of breath. He also denies palpitations and leg swelling.  After he realized [employer] sauce was giving him heartburn, he switched to [facility] on Friday nights and no longer has chest discomfort.  Has been trying a vegan diet.  Review of Systems: As per "subjective", otherwise negative.  Allergies  Allergen Reactions  . Atorvastatin Other (See Comments)    myalgias  . Sulfa Antibiotics   . Penicillins Rash    Current Outpatient Prescriptions  Medication Sig Dispense Refill  . aspirin 81 MG tablet Take 81 mg by mouth daily.    . fenofibrate 160 MG tablet Take 160 mg by mouth daily.    . metoprolol succinate (TOPROL-XL) 25 MG 24 hr tablet Take 1 tablet (25 mg total) by mouth daily. 90 tablet 3  . NITROSTAT 0.4 MG SL tablet PLACE 1 TABLET UNDER TONGUE EVERY 5 MINUTES FOR 3 DOSES AS NEEDED FOR CHEST PAIN 25 tablet 4  . pravastatin (PRAVACHOL) 80 MG tablet Take 80 mg by mouth daily.     No current facility-administered medications for this visit.    Past Medical History  Diagnosis Date  . Bradycardia   . Heart murmur   . Hyperlipidemia   . Arteriosclerotic cardiovascular disease (ASCVD) 08/2011    NSTEMI 7/13:  LHC with mRCA occluded tx with Xience DES to mRCA; minimal disease in CFX/LAD; EF of 45% with inferior hypokinesis; low blood pressure  . Tobacco abuse     60 pack years; 1.5 pack per day  . Myocardial infarct [facility])     Past Surgical History  Procedure Laterality Date  . Appendectomy    . Coronary stent placement    . Left heart catheterization with coronary angiogram N/A 09/11/2011    Procedure: LEFT HEART CATHETERIZATION WITH CORONARY ANGIOGRAM;  Surgeon: Sherren Mocha, MD;  Location: [facility] CATH LAB;  Service: Cardiovascular;  Laterality: N/A;  . Percutaneous coronary stent intervention (pci-s) Right 09/11/2011    Procedure: PERCUTANEOUS CORONARY STENT INTERVENTION (PCI-S);  Surgeon: Sherren Mocha, MD;  Location: [facility] CATH LAB;  Service: Cardiovascular;  Laterality: Right;    Social History   Social History  . Marital Status: Married    Spouse Name: N/A  . Number of Children: N/A  . Years of Education: N/A   Occupational History  . 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 Smoking status: Current Every Day Smoker -- 2.00 packs/day for 40 years    Types: Cigarettes    Start date: 03/02/1965  . Smokeless tobacco: Never Used  . Alcohol Use: No  . Drug Use: No  . Sexual Activity: Not on file   Other Topics Concern  . Not on file  Social History Narrative     Filed Vitals:   07/24/15 1604  BP: 100/60  Pulse: 67  Height: 5\' 7"  (1.702 m)  Weight: 207 lb (93.895 kg)  SpO2: 97%    PHYSICAL EXAM General: NAD HEENT: Normal. Neck: No JVD, no thyromegaly. Lungs: Clear to auscultation bilaterally with normal respiratory effort. CV: Nondisplaced PMI.  Regular rate and rhythm, normal S1/S2, no S3/S4, no murmur. No pretibial or periankle edema.  No carotid bruit.   Abdomen: Soft, nontender, no distention.  Neurologic: Alert and oriented.  Psych: Normal affect. Skin: Normal. Musculoskeletal: No gross deformities.    ECG: Most recent  ECG reviewed.      ASSESSMENT AND PLAN: 1. CAD: Symptomatically stable. No significant ischemia by nuclear MPI study 03/2014. Continue ASA, metoprolol, and statin.   2. Hyperlipidemia: Continue pravastatin 80 mg daily. Will obtain copy of most recent results.  Dispo: f/u 1 year.  Kate Sable, M.D., F.A.C.C.

## 2015-07-24 NOTE — Patient Instructions (Signed)

## 2016-05-27 DIAGNOSIS — E119 Type 2 diabetes mellitus without complications: Secondary | ICD-10-CM | POA: Diagnosis not present

## 2016-05-27 DIAGNOSIS — E8881 Metabolic syndrome: Secondary | ICD-10-CM | POA: Diagnosis not present

## 2016-05-27 DIAGNOSIS — N529 Male erectile dysfunction, unspecified: Secondary | ICD-10-CM | POA: Diagnosis not present

## 2016-05-27 DIAGNOSIS — E785 Hyperlipidemia, unspecified: Secondary | ICD-10-CM | POA: Diagnosis not present

## 2016-05-27 DIAGNOSIS — Z131 Encounter for screening for diabetes mellitus: Secondary | ICD-10-CM | POA: Diagnosis not present

## 2016-05-27 DIAGNOSIS — R972 Elevated prostate specific antigen [PSA]: Secondary | ICD-10-CM | POA: Diagnosis not present

## 2016-05-27 DIAGNOSIS — Z6832 Body mass index (BMI) 32.0-32.9, adult: Secondary | ICD-10-CM | POA: Diagnosis not present

## 2016-05-27 DIAGNOSIS — I2510 Atherosclerotic heart disease of native coronary artery without angina pectoris: Secondary | ICD-10-CM | POA: Diagnosis not present

## 2016-05-27 DIAGNOSIS — E669 Obesity, unspecified: Secondary | ICD-10-CM | POA: Diagnosis not present

## 2016-06-02 DIAGNOSIS — E669 Obesity, unspecified: Secondary | ICD-10-CM | POA: Diagnosis not present

## 2016-06-02 DIAGNOSIS — Z6832 Body mass index (BMI) 32.0-32.9, adult: Secondary | ICD-10-CM | POA: Diagnosis not present

## 2016-06-02 DIAGNOSIS — I2510 Atherosclerotic heart disease of native coronary artery without angina pectoris: Secondary | ICD-10-CM | POA: Diagnosis not present

## 2016-06-02 DIAGNOSIS — F439 Reaction to severe stress, unspecified: Secondary | ICD-10-CM | POA: Diagnosis not present

## 2016-06-02 DIAGNOSIS — E119 Type 2 diabetes mellitus without complications: Secondary | ICD-10-CM | POA: Diagnosis not present

## 2016-06-02 DIAGNOSIS — N529 Male erectile dysfunction, unspecified: Secondary | ICD-10-CM | POA: Diagnosis not present

## 2016-06-02 DIAGNOSIS — F17200 Nicotine dependence, unspecified, uncomplicated: Secondary | ICD-10-CM | POA: Diagnosis not present

## 2016-06-02 DIAGNOSIS — E8881 Metabolic syndrome: Secondary | ICD-10-CM | POA: Diagnosis not present

## 2016-06-02 DIAGNOSIS — E785 Hyperlipidemia, unspecified: Secondary | ICD-10-CM | POA: Diagnosis not present

## 2016-06-15 ENCOUNTER — Emergency Department (HOSPITAL_COMMUNITY): Payer: Medicare HMO

## 2016-06-15 ENCOUNTER — Encounter (HOSPITAL_COMMUNITY): Payer: Self-pay | Admitting: *Deleted

## 2016-06-15 ENCOUNTER — Observation Stay (HOSPITAL_COMMUNITY)
Admission: EM | Admit: 2016-06-15 | Discharge: 2016-06-17 | Disposition: A | Discharge status: Home / Self Care | Payer: Medicare HMO | Attending: Internal Medicine | Admitting: Internal Medicine

## 2016-06-15 DIAGNOSIS — F17210 Nicotine dependence, cigarettes, uncomplicated: Secondary | ICD-10-CM | POA: Diagnosis not present

## 2016-06-15 DIAGNOSIS — R079 Chest pain, unspecified: Secondary | ICD-10-CM | POA: Diagnosis present

## 2016-06-15 DIAGNOSIS — Z7982 Long term (current) use of aspirin: Secondary | ICD-10-CM | POA: Insufficient documentation

## 2016-06-15 DIAGNOSIS — I2510 Atherosclerotic heart disease of native coronary artery without angina pectoris: Secondary | ICD-10-CM | POA: Diagnosis present

## 2016-06-15 DIAGNOSIS — I200 Unstable angina: Secondary | ICD-10-CM | POA: Diagnosis not present

## 2016-06-15 DIAGNOSIS — R0789 Other chest pain: Secondary | ICD-10-CM | POA: Diagnosis present

## 2016-06-15 DIAGNOSIS — Z79899 Other long term (current) drug therapy: Secondary | ICD-10-CM | POA: Insufficient documentation

## 2016-06-15 DIAGNOSIS — E785 Hyperlipidemia, unspecified: Secondary | ICD-10-CM | POA: Diagnosis present

## 2016-06-15 DIAGNOSIS — J449 Chronic obstructive pulmonary disease, unspecified: Secondary | ICD-10-CM | POA: Diagnosis present

## 2016-06-15 DIAGNOSIS — I25110 Atherosclerotic heart disease of native coronary artery with unstable angina pectoris: Principal | ICD-10-CM | POA: Insufficient documentation

## 2016-06-15 DIAGNOSIS — E781 Pure hyperglyceridemia: Secondary | ICD-10-CM | POA: Diagnosis present

## 2016-06-15 DIAGNOSIS — F17200 Nicotine dependence, unspecified, uncomplicated: Secondary | ICD-10-CM

## 2016-06-15 NOTE — ED Triage Notes (Signed)
Pt states he was pulling vines in his yard x 2 days ago and experienced some chest pain and he stopped; pt states the pain went away but woke him up this am around 4 with pressure to his left chest with no radiation; pt states he did break out in a sweat tonight while changing a light bulb; pt states he had a heart cath with stent placement in 2013

## 2016-06-16 ENCOUNTER — Observation Stay (HOSPITAL_BASED_OUTPATIENT_CLINIC_OR_DEPARTMENT_OTHER): Payer: Medicare HMO

## 2016-06-16 DIAGNOSIS — I2510 Atherosclerotic heart disease of native coronary artery without angina pectoris: Secondary | ICD-10-CM | POA: Diagnosis not present

## 2016-06-16 DIAGNOSIS — J449 Chronic obstructive pulmonary disease, unspecified: Secondary | ICD-10-CM | POA: Diagnosis not present

## 2016-06-16 DIAGNOSIS — Z7982 Long term (current) use of aspirin: Secondary | ICD-10-CM | POA: Diagnosis not present

## 2016-06-16 DIAGNOSIS — R079 Chest pain, unspecified: Secondary | ICD-10-CM

## 2016-06-16 DIAGNOSIS — F17210 Nicotine dependence, cigarettes, uncomplicated: Secondary | ICD-10-CM | POA: Diagnosis not present

## 2016-06-16 DIAGNOSIS — Z79899 Other long term (current) drug therapy: Secondary | ICD-10-CM | POA: Diagnosis not present

## 2016-06-16 DIAGNOSIS — E785 Hyperlipidemia, unspecified: Secondary | ICD-10-CM | POA: Diagnosis not present

## 2016-06-16 DIAGNOSIS — F17200 Nicotine dependence, unspecified, uncomplicated: Secondary | ICD-10-CM

## 2016-06-16 DIAGNOSIS — I25110 Atherosclerotic heart disease of native coronary artery with unstable angina pectoris: Secondary | ICD-10-CM | POA: Diagnosis not present

## 2016-06-16 LAB — BASIC METABOLIC PANEL
ANION GAP: 4 — AB (ref 5–15)
BUN: 15 mg/dL (ref 6–20)
CO2: 28 mmol/L (ref 22–32)
Calcium: 9.1 mg/dL (ref 8.9–10.3)
Chloride: 107 mmol/L (ref 101–111)
Creatinine, Ser: 0.89 mg/dL (ref 0.61–1.24)
GFR calc Af Amer: >60 mL/min (ref >60)
Glucose, Bld: 109 mg/dL — ABNORMAL HIGH (ref 65–99)
POTASSIUM: 4.2 mmol/L (ref 3.5–5.1)
SODIUM: 139 mmol/L (ref 135–145)

## 2016-06-16 LAB — TROPONIN I
Troponin I: <0.03 ng/mL (ref <0.03)
Troponin I: <0.03 ng/mL (ref <0.03)

## 2016-06-16 LAB — CBC
HEMATOCRIT: 36.3 % — AB (ref 39.0–52.0)
HEMOGLOBIN: 12.5 g/dL — AB (ref 13.0–17.0)
MCH: 31.8 pg (ref 26.0–34.0)
MCHC: 34.4 g/dL (ref 30.0–36.0)
MCV: 92.4 fL (ref 78.0–100.0)
Platelets: 178 10*3/uL (ref 150–400)
RBC: 3.93 MIL/uL — ABNORMAL LOW (ref 4.22–5.81)
RDW: 12.2 % (ref 11.5–15.5)
WBC: 4.8 10*3/uL (ref 4.0–10.5)

## 2016-06-16 LAB — ECHOCARDIOGRAM COMPLETE
Height: 71 in
WEIGHTICAEL: 3629.65 [oz_av]

## 2016-06-16 LAB — I-STAT TROPONIN, ED: Troponin i, poc: 0 ng/mL (ref 0.00–0.08)

## 2016-06-16 LAB — APTT: aPTT: 30 seconds (ref 24–36)

## 2016-06-16 LAB — PROTIME-INR
INR: 1.04
Prothrombin Time: 13.7 seconds (ref 11.4–15.2)

## 2016-06-16 MED ORDER — METOPROLOL SUCCINATE ER 25 MG PO TB24: 12.5000 mg | ORAL_TABLET | Freq: Every day | ORAL | Status: DC

## 2016-06-16 MED ORDER — ENOXAPARIN SODIUM 100 MG/ML ~~LOC~~ SOLN
100.0000 mg | Freq: Two times a day (BID) | SUBCUTANEOUS | Status: DC
  Administered 2016-06-16: 100 mg via SUBCUTANEOUS
  Filled 2016-06-16: qty 1

## 2016-06-16 MED ORDER — MORPHINE SULFATE (PF) 2 MG/ML IV SOLN: 2.0000 mg | INTRAVENOUS | Status: DC | PRN

## 2016-06-16 MED ORDER — PRAVASTATIN SODIUM 40 MG PO TABS
80.0000 mg | ORAL_TABLET | Freq: Every day | ORAL | Status: DC
  Administered 2016-06-16 – 2016-06-17 (×2): 80 mg via ORAL
  Filled 2016-06-16 (×2): qty 2

## 2016-06-16 MED ORDER — ONDANSETRON HCL 4 MG/2ML IJ SOLN: 4.0000 mg | Freq: Four times a day (QID) | INTRAMUSCULAR | Status: DC | PRN

## 2016-06-16 MED ORDER — ASPIRIN EC 81 MG PO TBEC
81.0000 mg | DELAYED_RELEASE_TABLET | Freq: Every day | ORAL | Status: DC
  Administered 2016-06-16 – 2016-06-17 (×2): 81 mg via ORAL
  Filled 2016-06-16 (×2): qty 1

## 2016-06-16 MED ORDER — NITROGLYCERIN 2 % TD OINT
0.5000 [in_us] | TOPICAL_OINTMENT | Freq: Four times a day (QID) | TRANSDERMAL | Status: DC
  Administered 2016-06-16 – 2016-06-17 (×3): 0.5 [in_us] via TOPICAL
  Filled 2016-06-16 (×3): qty 1

## 2016-06-16 MED ORDER — HEPARIN (PORCINE) IN NACL 100-0.45 UNIT/ML-% IJ SOLN
1150.0000 [IU]/h | INTRAMUSCULAR | Status: DC
  Administered 2016-06-16: 1150 [IU]/h via INTRAVENOUS
  Filled 2016-06-16: qty 250

## 2016-06-16 MED ORDER — HEPARIN BOLUS VIA INFUSION
4000.0000 [IU] | Freq: Once | INTRAVENOUS | Status: AC
  Administered 2016-06-16: 4000 [IU] via INTRAVENOUS

## 2016-06-16 MED ORDER — MORPHINE SULFATE (PF) 4 MG/ML IV SOLN
4.0000 mg | Freq: Once | INTRAVENOUS | Status: DC
  Filled 2016-06-16: qty 1

## 2016-06-16 MED ORDER — ASPIRIN 81 MG PO CHEW
324.0000 mg | CHEWABLE_TABLET | Freq: Once | ORAL | Status: AC
  Administered 2016-06-16: 324 mg via ORAL
  Filled 2016-06-16: qty 4

## 2016-06-16 MED ORDER — GI COCKTAIL ~~LOC~~
30.0000 mL | Freq: Four times a day (QID) | ORAL | Status: DC | PRN
  Administered 2016-06-17: 30 mL via ORAL
  Filled 2016-06-16: qty 30

## 2016-06-16 MED ORDER — NITROGLYCERIN 2 % TD OINT
1.0000 [in_us] | TOPICAL_OINTMENT | Freq: Four times a day (QID) | TRANSDERMAL | Status: DC
  Administered 2016-06-16 (×3): 1 [in_us] via TOPICAL
  Filled 2016-06-16 (×3): qty 1

## 2016-06-16 MED ORDER — ENOXAPARIN SODIUM 40 MG/0.4ML ~~LOC~~ SOLN
40.0000 mg | Freq: Once | SUBCUTANEOUS | Status: DC
  Filled 2016-06-16: qty 0.4

## 2016-06-16 MED ORDER — FENOFIBRATE 160 MG PO TABS
160.0000 mg | ORAL_TABLET | Freq: Every day | ORAL | Status: DC
  Administered 2016-06-16 – 2016-06-17 (×2): 160 mg via ORAL
  Filled 2016-06-16 (×2): qty 1

## 2016-06-16 MED ORDER — VITAMIN C 500 MG PO TABS
2000.0000 mg | ORAL_TABLET | Freq: Every day | ORAL | Status: DC
  Administered 2016-06-16 – 2016-06-17 (×2): 2000 mg via ORAL
  Filled 2016-06-16 (×2): qty 4

## 2016-06-16 MED ORDER — METOPROLOL SUCCINATE ER 25 MG PO TB24
25.0000 mg | ORAL_TABLET | Freq: Every day | ORAL | Status: DC
  Filled 2016-06-16: qty 1

## 2016-06-16 MED ORDER — ACETAMINOPHEN 325 MG PO TABS: 650.0000 mg | ORAL_TABLET | ORAL | Status: DC | PRN

## 2016-06-16 NOTE — H&P (Signed)
History and Physical    Sean Gomez:[id number] DOB: [birth year] DOA: 06/15/2016  PCP: Anthoney Harada, MD  Patient coming from:  home  Chief Complaint:  Chest pain  HPI: Sean Gomez is a 66 y.o. male with medical history significant of CAD s/p stent in 2016 comes in with several days of substernal "pressure" that is persistent.  He thought it was due to his heartburn but it would not go away.  He denies any fevers.  No radiation of the pressure.  No change with exertioin.  No cough.  No sob.  No swelling in his legs.  No worse with deep breathing.  He reports no pressure at this time.  Pt is referred for admission for concerns for underlying ACS.   Review of Systems: As per HPI otherwise 10 point review of systems negative.   Past Medical History:  Diagnosis Date  . 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 Bradycardia   . Heart murmur   . Hyperlipidemia   . Myocardial infarct ([place])   . Tobacco abuse    60 pack years; 1.5 pack per day    Past Surgical History:  Procedure Laterality Date  . APPENDECTOMY    . CORONARY STENT PLACEMENT    . LEFT HEART CATHETERIZATION WITH CORONARY ANGIOGRAM N/A 09/11/2011   Procedure: LEFT HEART CATHETERIZATION WITH CORONARY ANGIOGRAM;  Surgeon: Sherren Mocha, MD;  Location: [facility] CATH LAB;  Service: Cardiovascular;  Laterality: N/A;  . PERCUTANEOUS CORONARY STENT INTERVENTION (PCI-S) Right 09/11/2011   Procedure: PERCUTANEOUS CORONARY STENT INTERVENTION (PCI-S);  Surgeon: Sherren Mocha, MD;  Location: [facility] CATH LAB;  Service: Cardiovascular;  Laterality: Right;     reports that he has been smoking Cigarettes.  He started smoking about 51 years ago. He has a 40.00 pack-year smoking history. He has never used smokeless tobacco. He reports that he does not drink alcohol  or use drugs.  Allergies  Allergen Reactions  . Atorvastatin Other (See Comments)    myalgias  . Sulfa Antibiotics   . Penicillins Rash    Family History  Problem Relation Age of Onset  . Aortic aneurysm Mother   . Stroke Father   . Coronary artery disease Neg Hx     Prior to Admission medications   Medication Sig Start Date End Date Taking? Authorizing Provider  Ascorbic Acid (VITAMIN C) 500 MG CAPS Take 2,000 mg by mouth daily.   Yes Historical Provider, MD  aspirin 81 MG tablet Take 81 mg by mouth daily.    Historical Provider, MD  fenofibrate 160 MG tablet Take 160 mg by mouth daily.    Historical Provider, MD  metoprolol succinate (TOPROL-XL) 25 MG 24 hr tablet Take 1 tablet (25 mg total) by mouth daily. 07/24/15   Herminio Commons, MD  NITROSTAT 0.4 MG SL tablet PLACE 1 TABLET UNDER TONGUE EVERY 5 MINUTES FOR 3 DOSES AS NEEDED FOR CHEST PAIN 01/16/13   Herminio Commons, MD  pravastatin (PRAVACHOL) 80 MG tablet Take 80 mg by mouth daily.    Historical Provider, MD    Physical Exam: Vitals:   06/16/16 0130 06/16/16 0200 06/16/16 0230 06/16/16 0300  BP: (!) 121/49 124/67 (!) 121/57 120/64  Pulse: (!) 48 (!) 53 (!) 50 (!) 44  Resp: 16 14 17 16   Temp:      TempSrc:  SpO2: 96% 95% 96% 96%  Weight:      Height:        Constitutional: NAD, calm, comfortable Vitals:   06/16/16 0130 06/16/16 0200 06/16/16 0230 06/16/16 0300  BP: (!) 121/49 124/67 (!) 121/57 120/64  Pulse: (!) 48 (!) 53 (!) 50 (!) 44  Resp: 16 14 17 16   Temp:      TempSrc:      SpO2: 96% 95% 96% 96%  Weight:      Height:       Eyes: PERRL, lids and conjunctivae normal ENMT: Mucous membranes are moist. Posterior pharynx clear of any exudate or lesions.Normal dentition.  Neck: normal, supple, no masses, no thyromegaly Respiratory: clear to auscultation bilaterally, no wheezing, no crackles. Normal respiratory effort. No accessory muscle use.  Cardiovascular: Regular rate and rhythm, no  murmurs / rubs / gallops. No extremity edema. 2+ pedal pulses. No carotid bruits.  Abdomen: no tenderness, no masses palpated. No hepatosplenomegaly. Bowel sounds positive.  Musculoskeletal: no clubbing / cyanosis. No joint deformity upper and lower extremities. Good ROM, no contractures. Normal muscle tone.  Skin: no rashes, lesions, ulcers. No induration Neurologic: CN 2-12 grossly intact. Sensation intact, DTR normal. Strength 5/5 in all 4.  Psychiatric: Normal judgment and insight. Alert and oriented x 3. Normal mood.    Labs on Admission: I have personally reviewed following labs and imaging studies  CBC:  Recent Labs Lab 06/16/16 0010  WBC 4.8  HGB 12.5*  HCT 36.3*  MCV 92.4  PLT 893   Basic Metabolic Panel:  Recent Labs Lab 06/16/16 0010  NA 139  K 4.2  CL 107  CO2 28  GLUCOSE 109*  BUN 15  CREATININE 0.89  CALCIUM 9.1   GFR: Estimated Creatinine Clearance: 101.1 mL/min (by C-G formula based on SCr of 0.89 mg/dL).  Coagulation Profile:  Recent Labs Lab 06/16/16 0010  INR 1.04    Radiological Exams on Admission: Dg Chest 2 View  Result Date: 06/16/2016 CLINICAL DATA:  Acute onset of mid to left-sided chest pain. Initial encounter. EXAM: CHEST  2 VIEW COMPARISON:  Chest radiograph performed 09/19/2012, and CT of the chest performed 10/12/2013 FINDINGS: The lungs are well-aerated and clear. There is no evidence of focal opacification, pleural effusion or pneumothorax. The heart is normal in size; the mediastinal contour is within normal limits. No acute osseous abnormalities are seen. IMPRESSION: No acute cardiopulmonary process seen. Electronically Signed   By: Garald Balding M.D.   On: 06/16/2016 01:41    EKG: Independently reviewed. nsr  rbbb old c/w old ekgs in system  Assessment/Plan 66 yo male h/o CAD/Stent in 2016 comes in with atypical chest pressure  Principal Problem:   Chest pain- serial trop.  Check cardiac echo in the am.  Old rbbb on ekg.   Aspirin.  Full lovenox until rules out.  Active Problems:   Hyperlipidemia- cont statin   Arteriosclerotic cardiovascular disease (ASCVD)- stable   COPD GOLD 0 - stable and compensated at this time    DVT prophylaxis:  scds Code Status:  full Family Communication: none  Disposition Plan:  Per day team Consults called:  none Admission status:  observation   DAVID,RACHAL A MD [facility]  If 7PM-7AM, please contact night-coverage [email or web address] Password [email or web address]  06/16/2016, 3:10 AM

## 2016-06-16 NOTE — Care Management Obs Status (Signed)
[place] NOTIFICATION   Patient Details  Name: Sean Gomez MRN: [id number] Date of Birth: Jan 20, 1951   Medicare Observation Status Notification Given:  Yes    Artavis Cowie, Chauncey Reading, RN 06/16/2016, 2:04 PM

## 2016-06-16 NOTE — Progress Notes (Signed)
Patient c/o mild headache. 1-2 Denies dizziness or syncopal feeling. BP low but asymptomatic.

## 2016-06-16 NOTE — Consult Note (Addendum)
Primary cardiologist: Dr Kate Sable Consulting cardiologist: Dr Carlyle Dolly  Requesting physician: Dr Murray Hodgkins Indication: chest pain  Clinical Summary Sean Gomez is a 66 y.o.male history of CAD with prior NSTEMI receiving a DES to RCA in July 2013, COPD, hyperlipidemia, admitted with chest pain. Symptoms started 1 week ago while pulling vines at the root from his trees. Nonspecific chest pain associated with SOB and diaphoresis. Went inside and relaxed, pain resolved. Recurrent symptoms on and off throughout the week. Has had some increased belching and gas, which sometimes helps symptoms. Reports symptoms somewhat similar to his prior heart attack pain but not as severe. Episode yesterday of walking down stairs with sudden diaphoresis, caused him to come for evaluation.      08/2011 cath: LM patent, LAD patent, LCX patent, RCA occluded with heavy thrombus burden. S/p DES Jan 2016 Nuclear stress: small inferolateral defect likely subdiaphragmatic attenuation, cannot exlcude mild area of ischemia, low risk. Duke treadmill score 6.  09/2012 stress echo: no ischemia K 4.2 Cr 0.89, Hgb 12.5, Plt 178,  Trop neg x 3 EKG SR, RBBB   Allergies  Allergen Reactions  . Atorvastatin Other (See Comments)    myalgias  . Sulfa Antibiotics   . Penicillins Rash    Medications Scheduled Medications: . aspirin EC  81 mg Oral Daily  . enoxaparin (LOVENOX) injection  100 mg Subcutaneous Q12H  . fenofibrate  160 mg Oral Daily  . metoprolol succinate  25 mg Oral Daily  . nitroGLYCERIN  1 inch Topical Q6H  . pravastatin  80 mg Oral Daily  . vitamin C  2,000 mg Oral Daily     Infusions:   PRN Medications:  acetaminophen, gi cocktail, morphine injection, ondansetron (ZOFRAN) IV   Past Medical History:  Diagnosis Date  . Arteriosclerotic cardiovascular disease (ASCVD) 08/2011   NSTEMI 7/13: LHC with mRCA occluded tx with Xience DES to mRCA; minimal disease in CFX/LAD; EF  of 45% with inferior hypokinesis; low blood pressure  . Bradycardia   . Heart murmur   . Hyperlipidemia   . Myocardial infarct ([place])   . Tobacco abuse    60 pack years; 1.5 pack per day    Past Surgical History:  Procedure Laterality Date  . APPENDECTOMY    . CORONARY STENT PLACEMENT    . LEFT HEART CATHETERIZATION WITH CORONARY ANGIOGRAM N/A 09/11/2011   Procedure: LEFT HEART CATHETERIZATION WITH CORONARY ANGIOGRAM;  Surgeon: Sherren Mocha, MD;  Location: [facility] CATH LAB;  Service: Cardiovascular;  Laterality: N/A;  . PERCUTANEOUS CORONARY STENT INTERVENTION (PCI-S) Right 09/11/2011   Procedure: PERCUTANEOUS CORONARY STENT INTERVENTION (PCI-S);  Surgeon: Sherren Mocha, MD;  Location: [facility] CATH LAB;  Service: Cardiovascular;  Laterality: Right;    Family History  Problem Relation Age of Onset  . Aortic aneurysm Mother   . Stroke Father   . Coronary artery disease Neg Hx     Social History Sean Gomez reports that he has been smoking Cigarettes.  He started smoking about 51 years ago. He has a 40.00 pack-year smoking history. He has never used smokeless tobacco. Sean Gomez reports that he does not drink alcohol.  Review of Systems CONSTITUTIONAL: No weight loss, fever, chills, weakness or fatigue.  HEENT: Eyes: No visual loss, blurred vision, double vision or yellow sclerae. No hearing loss, sneezing, congestion, runny nose or sore throat.  SKIN: No rash or itching.  CARDIOVASCULAR: per HPI RESPIRATORY: No shortness of breath, cough or sputum.  GASTROINTESTINAL: No anorexia, nausea, vomiting  or diarrhea. No abdominal pain or blood.  GENITOURINARY: no polyuria, no dysuria NEUROLOGICAL: No headache, dizziness, syncope, paralysis, ataxia, numbness or tingling in the extremities. No change in bowel or bladder control.  MUSCULOSKELETAL: No muscle, back pain, joint pain or stiffness.  HEMATOLOGIC: No anemia, bleeding or bruising.  LYMPHATICS: No enlarged nodes. No history of  splenectomy.  PSYCHIATRIC: No history of depression or anxiety.      Physical Examination Blood pressure (!) 103/49, pulse (!) 43, temperature 98.2 F (36.8 C), temperature source Oral, resp. rate 18, height 5\' 11"  (1.803 m), weight 226 lb 13.7 oz (102.9 kg), SpO2 98 %.  Intake/Output Summary (Last 24 hours) at 06/16/16 0931 Last data filed at 06/16/16 0500  Gross per 24 hour  Intake            30.28 ml  Output                0 ml  Net            30.28 ml    HEENT: sclera clear, throat clear  Cardiovascular: RRR, no m/r/g,  No jvd  Respiratory: CTAB  GI: abdomen soft, NT, ND  MSK: no LE edema  Neuro: no focal deficits  Psych: appropriate affect   Lab Results  Basic Metabolic Panel:  Recent Labs Lab 06/16/16 0010  NA 139  K 4.2  CL 107  CO2 28  GLUCOSE 109*  BUN 15  CREATININE 0.89  CALCIUM 9.1    Liver Function Tests: No results for input(s): AST, ALT, ALKPHOS, BILITOT, PROT, ALBUMIN in the last 168 hours.  CBC:  Recent Labs Lab 06/16/16 0010  WBC 4.8  HGB 12.5*  HCT 36.3*  MCV 92.4  PLT 178    Cardiac Enzymes:  Recent Labs Lab 06/16/16 0423 06/16/16 0627  TROPONINI <0.03 <0.03    BNP: Invalid input(s): POCBNP     Impression/Recommendations  1. Chest pain - history of CAD. Recent mixed chest pain symptoms over the last week - admitted early this AM, thus far no evdience of ACS. Note troponins negative only 5 hour time frame, continue to follow.  - medical therapy with ASA, toprol 25, prava 80, full dose lovenox  - f/u echo today. Anticipate likely exercise nuclear stress tomorrow. We will make NPO at midnight, hold beta blocker tomorrow - we will change lovenox to DVT prophylasix, at this time does not need continued full dose anticoagulation unless uptrend in troponin or new EKG changes, he favors limiting his needle sticks as well  2. Sinus bradycardia - asymptomatic, will lower dose of Toprol and place hold orders to hold  for rate < 60 - check TSH  3. Family history of ruptured aneurysm - patient reports concerns about his family history of AAA - negative [place] in 2013  Carlyle Dolly, M.D.

## 2016-06-16 NOTE — Progress Notes (Signed)
ANTICOAGULATION CONSULT NOTE - Preliminary  Pharmacy Consult for Enoxaparin Indication: ACS/NSTEMI/Chest pain  Allergies  Allergen Reactions  . Atorvastatin Other (See Comments)    myalgias  . Sulfa Antibiotics   . Penicillins Rash    Patient Measurements: Height: 5\' 11"  (180.3 cm) Weight: 226 lb 13.7 oz (102.9 kg) IBW/kg (Calculated) : 75.3 HEPARIN DW (KG): 96.8   Vital Signs: Temp: 97.9 F (36.6 C) (04/17 0340) Temp Source: Oral (04/17 0340) BP: 113/60 (04/17 0340) Pulse Rate: 47 (04/17 0340)  Labs:  Recent Labs  06/16/16 0010  HGB 12.5*  HCT 36.3*  PLT 178  APTT 30  LABPROT 13.7  INR 1.04  CREATININE 0.89   Estimated Creatinine Clearance: 101 mL/min (by C-G formula based on SCr of 0.89 mg/dL).  Medical History: Past Medical History:  Diagnosis Date  . 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 Bradycardia   . Heart murmur   . Hyperlipidemia   . Myocardial infarct ([place])   . Tobacco abuse    60 pack years; 1.5 pack per day    Medications:  Heparin bolus and infusion <<06/16/16  Assessment: 66 yo male with PMH sig for CAD, MI with stent placement in 2013. Admitted for persistent chest pain and concerns for ACS. Pt was started on heparin bolus and infusion and is to be transitioned to enoxaparin.  Goal of Therapy:  Heparin level goal: 0.6-1 unit/ml Monitor platelets by anticoagulation: Yes   Plan:  Heparin infusion stopped at 0500. First dose of enoxaparin is to be given 1.5 hours after the end of the infusion. Preliminary review of pertinent patient information completed.  Forestine Na clinical pharmacist will complete review during morning rounds to assess the patient and finalize treatment regimen.  Norberto Sorenson, [facility] 06/16/2016,5:02 AM

## 2016-06-16 NOTE — Care Management Note (Signed)
Case Management Note  Patient Details  Name: Sean Gomez MRN: [id number] Date of Birth: 05/13/1950  Subjective/Objective: Adm with CP. From home, ind with ADL's. Has PCP, still drives to appointments, has no issues affording medications. Stress test tomorrow 06/17/2016.                Action/Plan: Anticipate DC home with self care.    Expected Discharge Date:  06/16/16               Expected Discharge Plan:  Home/Self Care  In-House Referral:     Discharge planning Services  CM Consult  Post Acute Care Choice:    Choice offered to:  NA  DME Arranged:    DME Agency:     HH Arranged:    HH Agency:     Status of Service:  In process, will continue to follow  If discussed at Long Length of Stay Meetings, dates discussed:    Additional Comments:  Lorain Fettes, Chauncey Reading, RN 06/16/2016, 2:01 PM

## 2016-06-16 NOTE — Progress Notes (Signed)
  PROGRESS NOTE  Sean Gomez PRF:[id number] DOB: 1950/09/28 DOA: 06/15/2016 PCP: Anthoney Harada, MD  Brief Narrative: 6yom PMH CAD presented with chest pain. Admitted for ACS evaluation.  Assessment/Plan 1. Chest pain with some concern for angina.  Currently pain free.   Continue ASA, Toprol (hold after midnight), statin  Nuclear stress test in AM  2. PMH NSTEMI 2013, s/p DES 3. COPD, pulmonary nodules 4. Tobacco use disorder    Continue current care, f/u nuclear stress inAM  DVT prophylaxis: enoxaparin Code Status: full Family Communication: none Disposition Plan: home    Murray Hodgkins, MD  [facility] Direct contact: [phone] --[place]ia [place]  --[email or web address]; password [email or web address]  7PM-7AM contact night coverage as above 06/16/2016, 4:05 PM  LOS: 0 days   Consultants:  Cardiology   Procedures:  Echo    Impressions:  - Mild LVH with LVEF 55-60% and probable basal inferior   hypokinesis. Grossly normal diastolic function. Trivial mitral   regurgitation. Mildly sclerotic aortic annulus. Trivial tricuspid   regurgitation.  Antimicrobials:    Interval history/Subjective: No chest pain, no SOB. No dizziness.  Objective: Vitals:   06/16/16 0340 06/16/16 0820 06/16/16 1200 06/16/16 1500  BP: 113/60 (!) 103/49 (!) 99/43 (!) 93/38  Pulse: (!) 47 (!) 43 (!) 50 (!) 49  Resp: 16 18 17 18   Temp: 97.9 F (36.6 C) 98.2 F (36.8 C) 98.3 F (36.8 C) 98.4 F (36.9 C)  TempSrc: Oral Oral Oral Oral  SpO2: 97% 98% 98% 96%  Weight: 102.9 kg (226 lb 13.7 oz)     Height: 5\' 11"  (1.803 m)       Intake/Output Summary (Last 24 hours) at 06/16/16 1605 Last data filed at 06/16/16 1200  Gross per 24 hour  Intake           630.28 ml  Output                0 ml  Net           630.28 ml     Filed Weights   06/15/16 2341 06/16/16 0340  Weight: 103 kg (227 lb) 102.9 kg (226 lb 13.7 oz)    Exam:    Constitutional: appears calm and  comfortable CV: RRR no m/r/g.  Respiratory: CTA bilaterally, no w/r/r. Normal resp effort. Psych: grossly normal mood and affect, speech fluent and appropriate.  I have personally reviewed the following:   Labs:  BMP unremarkable  CBC unremarkable  troponins negative  Imaging studies:  CXR independely reviewed. No acute disease.  Medical tests:  Echo Impressions:  - Mild LVH with LVEF 55-60% and probable basal inferior   hypokinesis. Grossly normal diastolic function. Trivial mitral   regurgitation. Mildly sclerotic aortic annulus. Trivial tricuspid   regurgitation.  EKG independently reviewed: SB, RBBB no acute changes  Scheduled Meds: . aspirin EC  81 mg Oral Daily  . [START ON 06/17/2016] enoxaparin  40 mg Subcutaneous Once  . fenofibrate  160 mg Oral Daily  . nitroGLYCERIN  0.5 inch Topical Q6H  . pravastatin  80 mg Oral Daily  . vitamin C  2,000 mg Oral Daily   Continuous Infusions:  Principal Problem:   Chest pain Active Problems:   Hyperlipidemia   Arteriosclerotic cardiovascular disease (ASCVD)   COPD GOLD 0    Tobacco use disorder   LOS: 0 days

## 2016-06-16 NOTE — Progress Notes (Signed)
*  PRELIMINARY RESULTS* Echocardiogram 2D Echocardiogram has been performed.  Leavy Cella 06/16/2016, 2:27 PM

## 2016-06-16 NOTE — Progress Notes (Signed)
ANTICOAGULATION CONSULT NOTE - Preliminary  Pharmacy Consult for Heparin infusion Indication: chest pain/STEMI  Allergies  Allergen Reactions  . Atorvastatin Other (See Comments)    myalgias  . Sulfa Antibiotics   . Penicillins Rash    Patient Measurements: Height: 5\' 11"  (180.3 cm) Weight: 227 lb (103 kg) IBW/kg (Calculated) : 75.3 HEPARIN DW (KG): 96.8   Vital Signs: Temp: 98 F (36.7 C) (04/16 2341) Temp Source: Oral (04/16 2341) BP: 121/49 (04/17 0130) Pulse Rate: 48 (04/17 0130)  Labs:  Recent Labs  06/16/16 0010  HGB 12.5*  HCT 36.3*  PLT 178  CREATININE 0.89   Estimated Creatinine Clearance: 101.1 mL/min (by C-G formula based on SCr of 0.89 mg/dL).  Medical History: Past Medical History:  Diagnosis Date  . 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 Bradycardia   . Heart murmur   . Hyperlipidemia   . Myocardial infarct ([place])   . Tobacco abuse    60 pack years; 1.5 pack per day    Medications:   Assessment: 66 yo male with PMH of CAD with MI and s/p stent placement in 2013, seen in the ED for complaints of chest pain. Pt is to be started on heparin infusion for STEMI/ chest pain.  Goal of Therapy:  Heparin level goal: 0.3-0.7 units/ml Monitor platelets by anticoagulation protocol: Yes   Plan:  Heparin bolus: 4000 units Heparin infusion: 1150 units/hr (12 units/kg/hr weight adjusted)  Preliminary review of pertinent patient information completed.  Forestine Na clinical pharmacist will complete review during morning rounds to assess the patient and finalize treatment regimen.  Norberto Sorenson, [facility] 06/16/2016,1:54 AM

## 2016-06-16 NOTE — Progress Notes (Signed)
[place] for Enoxaparin Indication: ACS/NSTEMI/Chest pain  Allergies  Allergen Reactions  . Atorvastatin Other (See Comments)    myalgias  . Sulfa Antibiotics   . Penicillins Rash   Patient Measurements: Height: 5\' 11"  (180.3 cm) Weight: 226 lb 13.7 oz (102.9 kg) IBW/kg (Calculated) : 75.3 HEPARIN DW (KG): 96.8  Vital Signs: Temp: 98.2 F (36.8 C) (04/17 0820) Temp Source: Oral (04/17 0820) BP: 103/49 (04/17 0820) Pulse Rate: 43 (04/17 0820)  Labs:  Recent Labs  06/16/16 0010 06/16/16 0423 06/16/16 0627  HGB 12.5*  --   --   HCT 36.3*  --   --   PLT 178  --   --   APTT 30  --   --   LABPROT 13.7  --   --   INR 1.04  --   --   CREATININE 0.89  --   --   TROPONINI  --  <0.03 <0.03   Estimated Creatinine Clearance: 101 mL/min (by C-G formula based on SCr of 0.89 mg/dL).  Medical History: Past Medical History:  Diagnosis Date  . 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 Bradycardia   . Heart murmur   . Hyperlipidemia   . Myocardial infarct ([place])   . Tobacco abuse    60 pack years; 1.5 pack per day   Assessment: 66 yo male with PMH sig for CAD, MI with stent placement in 2013. Admitted for persistent chest pain and concerns for ACS. Pt was started on heparin bolus and infusion and is to be transitioned to enoxaparin.  Goal of Therapy:  Heparin level goal: 0.6-1 unit/ml Monitor platelets by anticoagulation: Yes   Plan:  Heparin infusion stopped at 0500. First dose of enoxaparin is to be given 1.5 hours after the end of the infusion. Lovenox 1mg /Kg SQ q12hrs Monitor CBC, s/sx bleeding complications  Hart Robinsons A, RPH 06/16/2016,8:38 AM

## 2016-06-16 NOTE — ED Provider Notes (Addendum)
Sean Gomez, Sean Gomez, Sean Gomez, Sean that this documentation has been prepared under the direction and in the presence of Varney Gomez, Sean Gomez, Sean Gomez  Patient presents with  . Chest Pain   HPI IZIK BINGMAN is a 66 y.o. male with a history of ASCVD and MI who presents to the Emergency Department complaining of sudden onset, persistent chest pain onset this morning. He endorses some mild discomfort during exam. He describes this as mild-moderate, dull, pressure-like pain that is exacerbated by exertion. He reports associated diaphoresis and shortness of breath onset tonight while walking down the stairs. Per pt, he was pulling vines in his yard three days ago when he became diaphoretic, but he denies any pain at that time. Pt reports he had a heart catheterization with stent placement in 2013.  He currently takes 1 baby aspirin per day. Per pt, he quit smoking 2 days ago, but endorses smoking 1 pack/day for the past year. Pt denies any nausea or vomiting and has no other acute complaints or associated symptoms at this time.   The history is provided by the patient. No language interpreter was used.   Past Medical History:  Diagnosis Date  . 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 Bradycardia   . Heart murmur   . Hyperlipidemia   . Myocardial infarct ([place])   . Tobacco abuse    60 pack years; 1.5 pack per day    Patient Active Problem List   Diagnosis Date Noted  . COPD GOLD 0  12/14/2012  . Multiple pulmonary nodules 09/28/2012  . Hx of diagnostic tests 12/31/2011  . Cigarette smoker   . Hyperlipidemia  09/12/2011  . Arteriosclerotic cardiovascular disease (ASCVD) 08/31/2011    Past Surgical History:  Procedure Laterality Date  . APPENDECTOMY    . CORONARY STENT PLACEMENT    . LEFT HEART CATHETERIZATION WITH CORONARY ANGIOGRAM N/A 09/11/2011   Procedure: LEFT HEART CATHETERIZATION WITH CORONARY ANGIOGRAM;  Surgeon: Sherren Mocha, Sean;  Location: [facility] CATH LAB;  Service: Cardiovascular;  Laterality: N/A;  . PERCUTANEOUS CORONARY STENT INTERVENTION (PCI-S) Right 09/11/2011   Procedure: PERCUTANEOUS CORONARY STENT INTERVENTION (PCI-S);  Surgeon: Sherren Mocha, Sean;  Location: [facility] CATH LAB;  Service: Cardiovascular;  Laterality: Right;      Home Medications    Prior to Admission medications   Medication Sig Start Date End Date Taking? Authorizing Provider  Ascorbic Acid (VITAMIN C) 500 MG CAPS Take 2,000 mg by mouth daily.   Yes Historical Provider, Sean  aspirin 81 MG tablet Take 81 mg by mouth daily.    Historical Provider, Sean  fenofibrate 160 MG tablet Take 160 mg by mouth daily.    Historical Provider, Sean  metoprolol succinate (TOPROL-XL) 25 MG 24 hr tablet Take 1 tablet (25 mg total) by mouth daily. 07/24/15   Herminio Commons, Sean  NITROSTAT 0.4 MG SL tablet PLACE 1 TABLET UNDER TONGUE EVERY 5 MINUTES FOR 3 DOSES AS NEEDED FOR CHEST PAIN 01/16/13   Herminio Commons, Sean  pravastatin (PRAVACHOL) 80 MG tablet Take 80 mg by mouth daily.    Historical Provider, Sean    [facility]  History Family History  Problem Relation Age of Onset  . Aortic aneurysm Mother   . Stroke Father   . Coronary artery disease Neg Hx     Social History Social History  Substance Use Topics  . Smoking status: Current Every Day Smoker    Packs/day: 1.00    Years: 40.00    Types: Cigarettes    Start date: 03/02/1965  . Smokeless tobacco: Never Used  . Alcohol use No     Allergies   Atorvastatin; Sulfa antibiotics; and Penicillins   Review of Systems Review of Systems  Constitutional: Positive for  diaphoresis.  Respiratory: Positive for shortness of breath.   Cardiovascular: Positive for chest pain.  Gastrointestinal: Negative for nausea and vomiting.  All other systems reviewed and are negative.  Physical Exam Updated Vital Signs BP 119/65   Pulse (!) 58   Temp 98 F (36.7 C) (Oral)   Resp 16   Ht 5\' 11"  (1.803 m)   Wt 227 lb (103 kg)   SpO2 96%   BMI 31.66 kg/m   Physical Exam  Constitutional: He is oriented to person, place, and time. He appears well-developed and well-nourished. No distress.  HENT:  Head: Normocephalic and atraumatic.  Eyes: Conjunctivae are normal.  Cardiovascular: Normal rate and regular rhythm.   2+ and equal radial pulses bialterally  Pulmonary/Chest: Effort normal.  Lungs CTA  Abdominal: Soft. He exhibits no distension. There is no tenderness.  Neurological: He is alert and oriented to person, place, and time.  Skin: Skin is warm and dry.  Psychiatric: He has a normal mood and affect.  Nursing note and vitals reviewed.   ED Treatments / Results  DIAGNOSTIC STUDIES:  Oxygen Saturation is 96% on RA, normal by my interpretation.    COORDINATION OF CARE:  12:32 AM Will order  Discussed treatment plan with pt at bedside and pt agreed to plan.   Labs (all labs ordered are listed, but only abnormal results are displayed) Labs Reviewed  BASIC METABOLIC PANEL - Abnormal; Notable for the following:       Result Value   Glucose, Bld 109 (*)    Anion gap 4 (*)    All other components within normal limits  CBC - Abnormal; Notable for the following:    RBC 3.93 (*)    Hemoglobin 12.5 (*)    HCT 36.3 (*)    All other components within normal limits  Sean Gomez-STAT TROPOININ, ED    EKG  EKG Interpretation  Date/Time:  Monday June 15 2016 23:35:54 EDT Ventricular Rate:  58 PR Interval:  162 QRS Duration: 112 QT Interval:  418 QTC Calculation: 410 R Axis:   -27 Text Interpretation:  Sinus bradycardia Right bundle branch block Abnormal ECG  No acute changes No significant change since last tracing Confirmed by Kathrynn Humble, Sean, Thelma Comp ([id number]) on 06/16/2016 12:17:28 AM       Radiology No results found.  Procedures Procedures (including critical care time)  CRITICAL CARE Performed by: Varney Gomez   Total critical care time: 40 minutes  Critical care time was exclusive of separately billable procedures and treating other patients.  Critical care was necessary to treat or prevent imminent or life-threatening deterioration.  Critical care was time spent personally by me on the following activities: development of treatment plan with patient and/or surrogate as well as nursing, discussions with consultants, evaluation of patient's response to treatment, examination of patient, obtaining history from patient or surrogate, ordering and performing treatments and interventions, ordering  and review of laboratory studies, ordering and review of radiographic studies, pulse oximetry and re-evaluation of patient's condition.   Medications Ordered in ED Medications  aspirin chewable tablet 324 mg (not administered)     Initial Impression / Assessment and Plan / ED Course  Sean Gomez have reviewed the triage vital signs and the nursing notes.  Pertinent labs & imaging results that were available during my care of the patient were reviewed by me and considered in my medical decision making (see chart for details).    Pt comes in with cc of chest pain. Chest discomfort x 3-4 days, with episodes of nausea, diophoresis, indigestion and exertional dyspnea - all concerning for possible ACS in the setting of a patient who has known CAD hx. Pt had a neg myoview in 2016, nothing since then.  Pt is still having mild discomfort right now - but it is barely there and pt has refused nitro. We will start him on heparin and give him ASA.  Admission anticipated.   Final Clinical Impressions(s) / ED Diagnoses   Final diagnoses:  Unstable angina  (HCC)    New Prescriptions New Prescriptions   No medications on file   Sean Gomez personally performed the services described in this documentation, which was scribed in my presence. The recorded information has been reviewed and is accurate.    Varney Biles, Sean 06/16/16 2706    Varney Biles, Sean 06/16/16 2376

## 2016-06-17 ENCOUNTER — Encounter (HOSPITAL_COMMUNITY): Payer: Self-pay

## 2016-06-17 ENCOUNTER — Observation Stay (HOSPITAL_BASED_OUTPATIENT_CLINIC_OR_DEPARTMENT_OTHER): Payer: Medicare HMO

## 2016-06-17 DIAGNOSIS — F17200 Nicotine dependence, unspecified, uncomplicated: Secondary | ICD-10-CM | POA: Diagnosis not present

## 2016-06-17 DIAGNOSIS — I2510 Atherosclerotic heart disease of native coronary artery without angina pectoris: Secondary | ICD-10-CM | POA: Diagnosis not present

## 2016-06-17 DIAGNOSIS — E785 Hyperlipidemia, unspecified: Secondary | ICD-10-CM | POA: Diagnosis not present

## 2016-06-17 DIAGNOSIS — R079 Chest pain, unspecified: Secondary | ICD-10-CM

## 2016-06-17 DIAGNOSIS — J449 Chronic obstructive pulmonary disease, unspecified: Secondary | ICD-10-CM

## 2016-06-17 LAB — NM MYOCAR MULTI W/SPECT W/WALL MOTION / EF
CHL CUP NUCLEAR SDS: 0
CHL CUP RESTING HR STRESS: 53 {beats}/min
CSEPPHR: 150 {beats}/min
Estimated workload: 10.1 METS
Exercise duration (min): 8 min
Exercise duration (sec): 31 s
LHR: 0.04
LVDIAVOL: 112 mL (ref 62–150)
LVSYSVOL: 48 mL
MPHR: 155 {beats}/min
Percent HR: 96 %
RPE: 17
SRS: 1
SSS: 1
TID: 1.15

## 2016-06-17 LAB — TSH: TSH: 2.394 u[IU]/mL (ref 0.350–4.500)

## 2016-06-17 MED ORDER — TECHNETIUM TC 99M TETROFOSMIN IV KIT
10.0000 | PACK | Freq: Once | INTRAVENOUS | Status: AC | PRN
  Administered 2016-06-17: 11 via INTRAVENOUS

## 2016-06-17 MED ORDER — REGADENOSON 0.4 MG/5ML IV SOLN
INTRAVENOUS | Status: DC
Start: 2016-06-17 — End: 2016-06-17
  Filled 2016-06-17: qty 5

## 2016-06-17 MED ORDER — FAMOTIDINE 20 MG PO TABS: 20.0000 mg | ORAL_TABLET | Freq: Two times a day (BID) | ORAL | 0 refills | Status: DC

## 2016-06-17 MED ORDER — RANOLAZINE ER 500 MG PO TB12
500.0000 mg | ORAL_TABLET | Freq: Two times a day (BID) | ORAL | Status: DC
  Administered 2016-06-17: 500 mg via ORAL
  Filled 2016-06-17: qty 1

## 2016-06-17 MED ORDER — FAMOTIDINE 20 MG PO TABS
20.0000 mg | ORAL_TABLET | Freq: Two times a day (BID) | ORAL | Status: DC
  Administered 2016-06-17: 20 mg via ORAL
  Filled 2016-06-17: qty 1

## 2016-06-17 MED ORDER — SODIUM CHLORIDE 0.9% FLUSH
INTRAVENOUS | Status: AC
  Administered 2016-06-17: 10 mL via INTRAVENOUS
  Filled 2016-06-17: qty 10

## 2016-06-17 MED ORDER — TECHNETIUM TC 99M TETROFOSMIN IV KIT
30.0000 | PACK | Freq: Once | INTRAVENOUS | Status: AC | PRN
  Administered 2016-06-17: 30 via INTRAVENOUS

## 2016-06-17 MED ORDER — NITROGLYCERIN 0.4 MG SL SUBL
SUBLINGUAL_TABLET | SUBLINGUAL | Status: AC
  Administered 2016-06-17: 0.4 mg
  Filled 2016-06-17: qty 1

## 2016-06-17 MED ORDER — RANOLAZINE ER 500 MG PO TB12: 500.0000 mg | ORAL_TABLET | Freq: Two times a day (BID) | ORAL | 0 refills | Status: DC

## 2016-06-17 NOTE — Progress Notes (Signed)
Progress Note  Patient Name: Sean Gomez Date of Encounter: 06/17/2016  Primary Cardiologist: Dr. Bronson Ing  Subjective     Inpatient Medications    Scheduled Meds: . aspirin EC  81 mg Oral Daily  . enoxaparin  40 mg Subcutaneous Once  . fenofibrate  160 mg Oral Daily  . nitroGLYCERIN  0.5 inch Topical Q6H  . pravastatin  80 mg Oral Daily  . vitamin C  2,000 mg Oral Daily   Continuous Infusions:  PRN Meds: acetaminophen, gi cocktail, morphine injection, ondansetron (ZOFRAN) IV   Vital Signs    Vitals:   06/16/16 1200 06/16/16 1500 06/16/16 2240 06/17/16 0645  BP: (!) 99/43 (!) 93/38 98/60 (!) 100/59  Pulse: (!) 50 (!) 49 (!) 52 60  Resp: 17 18 18 18   Temp: 98.3 F (36.8 C) 98.4 F (36.9 C) 98.2 F (36.8 C) 98.4 F (36.9 C)  TempSrc: Oral Oral Oral Oral  SpO2: 98% 96% 95% 95%  Weight:      Height:        Intake/Output Summary (Last 24 hours) at 06/17/16 0916 Last data filed at 06/16/16 1200  Gross per 24 hour  Intake              360 ml  Output                0 ml  Net              360 ml   Filed Weights   06/15/16 2341 06/16/16 0340  Weight: 227 lb (103 kg) 226 lb 13.7 oz (102.9 kg)    Telemetry    Sinus bradycardia in 40's- Personally Reviewed  ECG    Sinus bradycardia with RBBB - Personally Reviewed   Physical Examination: GEN: No acute distress.   Neck: No JVD Cardiac: RRR, no murmurs, rubs, or gallops.  Respiratory: Clear to auscultation bilaterally. GI: Soft, nontender, non-distended  MS: No edema; No deformity. Neuro:  Nonfocal  Psych: Normal affect   Labs    Chemistry Recent Labs Lab 06/16/16 0010  NA 139  K 4.2  CL 107  CO2 28  GLUCOSE 109*  BUN 15  CREATININE 0.89  CALCIUM 9.1  GFRNONAA >60  GFRAA >60  ANIONGAP 4*     Hematology Recent Labs Lab 06/16/16 0010  WBC 4.8  RBC 3.93*  HGB 12.5*  HCT 36.3*  MCV 92.4  MCH 31.8  MCHC 34.4  RDW 12.2  PLT 178    Cardiac Enzymes Recent Labs Lab  06/16/16 0423 06/16/16 0627 06/16/16 0941  TROPONINI <0.03 <0.03 <0.03    Recent Labs Lab 06/16/16 0101  TROPIPOC 0.00     BNPNo results for input(s): BNP, PROBNP in the last 168 hours.   DDimer No results for input(s): DDIMER in the last 168 hours.   Radiology    Dg Chest 2 View  Result Date: 06/16/2016 CLINICAL DATA:  Acute onset of mid to left-sided chest pain. Initial encounter. EXAM: CHEST  2 VIEW COMPARISON:  Chest radiograph performed 09/19/2012, and CT of the chest performed 10/12/2013 FINDINGS: The lungs are well-aerated and clear. There is no evidence of focal opacification, pleural effusion or pneumothorax. The heart is normal in size; the mediastinal contour is within normal limits. No acute osseous abnormalities are seen. IMPRESSION: No acute cardiopulmonary process seen. Electronically Signed   By: Garald Balding M.D.   On: 06/16/2016 01:41    Cardiac Studies    2Decho  Study Conclusions   -  Left ventricle: The cavity size was normal. Wall thickness was   increased in a pattern of mild LVH. Systolic function was normal.   The estimated ejection fraction was in the range of 55% to 60%.   Probable hypokinesis of the basalinferior myocardium. Left   ventricular diastolic function parameters were normal. - Aortic valve: Mildly calcified annulus. Trileaflet. - Mitral valve: There was trivial regurgitation. - Right atrium: Central venous pressure (est): 3 mm Hg. - Tricuspid valve: There was trivial regurgitation. - Pulmonary arteries: Systolic pressure could not be accurately   estimated. - Pericardium, extracardiac: There was no pericardial effusion.   Impressions:   - Mild LVH with LVEF 55-60% and probable basal inferior   hypokinesis. Grossly normal diastolic function. Trivial mitral   regurgitation. Mildly sclerotic aortic annulus. Trivial tricuspid   regurgitation.    Patient Profile     66 y.o. male    Assessment & Plan    1. Chest pain - history  of CAD. Recent mixed chest pain symptoms over the last week -  no evdience of ACS.   troponins negative x 3- medical therapy with ASA, toprol 25, prava 80, full dose lovenox  - f/u echo  With normal LVEF.exercise nuclear stress today    2. Sinus bradycardia - asymptomatic, will lower dose of Toprol and place hold orders to hold for rate < 60 - check TSH   3. Family history of ruptured aneurysm - patient reports concerns about his family history of AAA - negative [place] in 2013     Signed, Ermalinda Barrios, [place]  06/17/2016, 9:16 AM

## 2016-06-17 NOTE — Progress Notes (Signed)
Patient complaining of chest pain.  One sublingual nitro given per protocol.  Pain subsided.  Patients vitals stable.  Will continue to monitor and notify MD via text page.

## 2016-06-17 NOTE — Progress Notes (Signed)
Discharge instructions gone over with patient. Verbalized understanding. IV removed, patient tolerated procedure well.

## 2016-06-17 NOTE — Progress Notes (Addendum)
Low risk stress test. 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 No ischemic EKG changes, low risk [facility]ke treadmill score of 9. No indication for cath at this time. Off beta blocker due to sinus bradycardia. Low normal bp's limit other antianginals, will try ranexa 500mg  bid. Trial of zantac for possible GI symptoms. Follow sinus bradycardia off TOprol, he has been asymptomatic with normal chronotropic response on treadmill, no indication for further intervention. [place] for discharge from cardiac standpoint, we will arrange close outpatient f/u.   Carlyle Dolly MD

## 2016-06-17 NOTE — Discharge Summary (Signed)
Physician Discharge Summary  CONNELLY NETTERVILLE JJK:[id number] DOB: [birth year] DOA: 06/15/2016  PCP: Anthoney Harada, MD  Admit date: 06/15/2016 Discharge date: 06/17/2016  Admitted From: home Disposition:  home  Recommendations for Outpatient Follow-up:  1. Follow up with PCP in 1-2 weeks 2. Please obtain BMP/CBC in one week 3. Follow-up with cardiology on 5/2   Discharge Condition: stable CODE STATUS: full code Diet recommendation: Heart Healthy   Brief/Interim Summary: 66 year old male with past history of coronary artery disease status post stent placement in 2013, COPD, tobacco use disorder, hyperlipidemia who presents to the hospital with complaints of chest discomfort. Pain was somewhat atypical, but due to his medical history was admitted for further evaluation. He ruled out for ACS with negative cardiac markers. Echocardiogram showed normal ejection fraction. He was seen by cardiology and underwent stress testing that was found to be low risk study. It is felt that his pain may be related to acid reflux and indigestion. He'll be placed on a trial of H2 blockers. Cardiology has also started him on Ranexa. They will follow up with him in the next 2 weeks. He's also been asked follow-up with his primary care physician. At this time is no longer having any discomfort and is felt stable for discharge home. His Toprol has been discontinued due to observed bradycardia on telemetry. This can be further followed as an outpatient.  Discharge Diagnoses:  Principal Problem:   Chest pain Active Problems:   Hyperlipidemia   Arteriosclerotic cardiovascular disease (ASCVD)   COPD GOLD 0    Tobacco use disorder    Discharge Instructions  Discharge Instructions    Diet - low sodium heart healthy    Complete by:  As directed    Increase activity slowly    Complete by:  As directed      Allergies as of 06/17/2016      Reactions   Atorvastatin Other (See Comments)   myalgias    Sulfa Antibiotics    Penicillins Rash   Childhood allergy. Has patient had a PCN reaction causing immediate rash, facial/tongue/throat swelling, SOB or lightheadedness with hypotension: unknown Has patient had a PCN reaction causing severe rash involving mucus membranes or skin necrosis: unknown Has patient had a PCN reaction that required hospitalization: no Has patient had a PCN reaction occurring within the last 10 years: no If all of the above answers are "NO", then may proceed with Cephalosporin use.      Medication List    STOP taking these medications   metoprolol succinate 25 MG 24 hr tablet Commonly known as:  TOPROL-XL     TAKE these medications   aspirin 81 MG tablet Take 81 mg by mouth daily.   famotidine 20 MG tablet Commonly known as:  PEPCID Take 1 tablet (20 mg total) by mouth 2 (two) times daily.   fenofibrate 160 MG tablet Take 160 mg by mouth daily.   NITROSTAT 0.4 MG SL tablet Generic drug:  nitroGLYCERIN PLACE 1 TABLET UNDER TONGUE EVERY 5 MINUTES FOR 3 DOSES AS NEEDED FOR CHEST PAIN   pravastatin 80 MG tablet Commonly known as:  PRAVACHOL Take 80 mg by mouth daily.   ranolazine 500 MG 12 hr tablet Commonly known as:  RANEXA Take 1 tablet (500 mg total) by mouth 2 (two) times daily.   Vitamin C 500 MG Caps Take 2,000 mg by mouth daily.      Follow-up Information    Ermalinda Barrios, PA-C On 07/01/2016.   Specialty:  Cardiology  Why:  at  11:30 am Contact information: [address] [place]  [address] [phone]          Allergies  Allergen Reactions  . Atorvastatin Other (See Comments)    myalgias  . Sulfa Antibiotics   . Penicillins Rash    Childhood allergy. Has patient had a PCN reaction causing immediate rash, facial/tongue/throat swelling, SOB or lightheadedness with hypotension: unknown Has patient had a PCN reaction causing severe rash involving mucus membranes or skin necrosis: unknown Has patient had a PCN  reaction that required hospitalization: no Has patient had a PCN reaction occurring within the last 10 years: no If all of the above answers are "NO", then may proceed with Cephalosporin use.     Consultations:  Cardiology   Procedures/Studies: Dg Chest 2 View  Result Date: 06/16/2016 CLINICAL DATA:  Acute onset of mid to left-sided chest pain. Initial encounter. EXAM: CHEST  2 VIEW COMPARISON:  Chest radiograph performed 09/19/2012, and CT of the chest performed 10/12/2013 FINDINGS: The lungs are well-aerated and clear. There is no evidence of focal opacification, pleural effusion or pneumothorax. The heart is normal in size; the mediastinal contour is within normal limits. No acute osseous abnormalities are seen. IMPRESSION: No acute cardiopulmonary process seen. Electronically Signed   By: Garald Balding M.D.   On: 06/16/2016 01:41   Nm Myocar Multi W/spect W/wall Motion / Ef  Result Date: 06/17/2016  Blood pressure demonstrated a normal response to exercise.  There was no ST segment deviation noted during stress. Normal chronotropic response to exercise.  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 There is no active ischemia.  This is a low risk study.  The left ventricular ejection fraction is normal (55-65%).  [facility] treadmill score of 9, consistent with low risk for major cardiac events.     Echo: - Mild LVH with LVEF 55-60% and probable basal inferior   hypokinesis. Grossly normal diastolic function. Trivial mitral   regurgitation. Mildly sclerotic aortic annulus. Trivial tricuspid   regurgitation.   Subjective: No further chest pain, feeling better  Discharge Exam: Vitals:   06/16/16 2240 06/17/16 0645  BP: 98/60 (!) 100/59  Pulse: (!) 52 60  Resp: 18 18  Temp: 98.2 F (36.8 C) 98.4 F (36.9 C)   Vitals:   06/16/16 1200 06/16/16 1500 06/16/16 2240 06/17/16 0645  BP: (!) 99/43 (!) 93/38 98/60 (!) 100/59  Pulse: (!) 50 (!) 49 (!) 52 60   Resp: 17 18 18 18   Temp: 98.3 F (36.8 C) 98.4 F (36.9 C) 98.2 F (36.8 C) 98.4 F (36.9 C)  TempSrc: Oral Oral Oral Oral  SpO2: 98% 96% 95% 95%  Weight:      Height:        General: Pt is alert, awake, not in acute distress Cardiovascular: RRR, S1/S2 +, no rubs, no gallops Respiratory: CTA bilaterally, no wheezing, no rhonchi Abdominal: Soft, NT, ND, bowel sounds + Extremities: no edema, no cyanosis    The results of significant diagnostics from this hospitalization (including imaging, microbiology, ancillary and laboratory) are listed below for reference.     Microbiology: No results found for this or any previous visit (from the past 240 hour(s)).   Labs: BNP (last 3 results) No results for input(s): BNP in the last 8760 hours. Basic Metabolic Panel:  Recent Labs Lab 06/16/16 0010  NA 139  K 4.2  CL 107  CO2 28  GLUCOSE 109*  BUN 15  CREATININE 0.89  CALCIUM 9.1   Liver Function Tests: No results for input(s): AST, ALT, ALKPHOS, BILITOT, PROT, ALBUMIN in the last 168 hours. No results for input(s): LIPASE, AMYLASE in the last 168 hours. No results for input(s): AMMONIA in the last 168 hours. CBC:  Recent Labs Lab 06/16/16 0010  WBC 4.8  HGB 12.5*  HCT 36.3*  MCV 92.4  PLT 178   Cardiac Enzymes:  Recent Labs Lab 06/16/16 0423 06/16/16 0627 06/16/16 0941  TROPONINI <0.03 <0.03 <0.03   BNP: Invalid input(s): POCBNP CBG: No results for input(s): GLUCAP in the last 168 hours. D-Dimer No results for input(s): DDIMER in the last 72 hours. Hgb A1c No results for input(s): HGBA1C in the last 72 hours. Lipid Profile No results for input(s): CHOL, HDL, LDLCALC, TRIG, CHOLHDL, LDLDIRECT in the last 72 hours. Thyroid function studies  Recent Labs  06/17/16 0544  TSH 2.394   Anemia work up No results for input(s): VITAMINB12, FOLATE, FERRITIN, TIBC, IRON, RETICCTPCT in the last 72 hours. Urinalysis No results found for: COLORURINE,  APPEARANCEUR, LABSPEC, [place], GLUCOSEU, HGBUR, BILIRUBINUR, KETONESUR, PROTEINUR, UROBILINOGEN, NITRITE, LEUKOCYTESUR Sepsis Labs Invalid input(s): PROCALCITONIN,  WBC,  LACTICIDVEN Microbiology No results found for this or any previous visit (from the past 240 hour(s)).   Time coordinating discharge: Over 30 minutes  SIGNED:   Kathie Dike, MD  [facility] 06/17/2016, 2:33 PM Pager   If 7PM-7AM, please contact night-coverage [email or web address] Password [email or web address]

## 2016-06-17 NOTE — Progress Notes (Signed)
Patient ID: Sean Gomez, male   DOB: 10/21/1950, 66 y.o.   MRN: [id number]        Subjective:    Patient had some chest pain overnight. Resolved with NG x 1.   Objective:   Temp:  [98.2 F (36.8 C)-98.4 F (36.9 C)] 98.4 F (36.9 C) (04/18 0645) Pulse Rate:  [49-60] 60 (04/18 0645) Resp:  [17-18] 18 (04/18 0645) BP: (93-100)/(38-60) 100/59 (04/18 0645) SpO2:  [95 %-98 %] 95 % (04/18 0645) Last BM Date: 06/16/16  Filed Weights   06/15/16 2341 06/16/16 0340  Weight: 227 lb (103 kg) 226 lb 13.7 oz (102.9 kg)    Intake/Output Summary (Last 24 hours) at 06/17/16 0919 Last data filed at 06/16/16 1200  Gross per 24 hour  Intake              360 ml  Output                0 ml  Net              360 ml    Telemetry: sinus brady 40s  Exam:  General: NAD  HEENT: sclera clear, throat clear  Resp: CTAB  Cardiac: RRR, no m/r/g, no jvd  GI: abdomen soft, NT,ND  MSK: no LE edema  Neuro:no focal deficits   Psych: appropriate affect  Lab Results:  Basic Metabolic Panel:  Recent Labs Lab 06/16/16 0010  NA 139  K 4.2  CL 107  CO2 28  GLUCOSE 109*  BUN 15  CREATININE 0.89  CALCIUM 9.1    Liver Function Tests: No results for input(s): AST, ALT, ALKPHOS, BILITOT, PROT, ALBUMIN in the last 168 hours.  CBC:  Recent Labs Lab 06/16/16 0010  WBC 4.8  HGB 12.5*  HCT 36.3*  MCV 92.4  PLT 178    Cardiac Enzymes:  Recent Labs Lab 06/16/16 0423 06/16/16 0627 06/16/16 0941  TROPONINI <0.03 <0.03 <0.03    BNP: No results for input(s): PROBNP in the last 8760 hours.  Coagulation:  Recent Labs Lab 06/16/16 0010  INR 1.04    ECG:   Medications:   Scheduled Medications: . aspirin EC  81 mg Oral Daily  . enoxaparin  40 mg Subcutaneous Once  . fenofibrate  160 mg Oral Daily  . nitroGLYCERIN  0.5 inch Topical Q6H  . pravastatin  80 mg Oral Daily  . vitamin C  2,000 mg Oral Daily     Infusions:   PRN Medications:  acetaminophen,  gi cocktail, morphine injection, ondansetron (ZOFRAN) IV     Assessment/Plan   1. Chest pain - history of CAD. Recent mixed chest pain symptoms over the last week - negative troponins this admit. EKG without acute ischemic changes.  - echo LVEF 55-60%, possible basal infeiror hypokinesis -plan for exercise nuclear stress test today.   2. Sinus bradycardia - asymptomatic, Toprol is on hold - normal TSH - follow heart rate response with exercise stress today.     Carlyle Dolly, M.D.        Carlyle Dolly, M.D., F.A.C.C.

## 2016-06-18 ENCOUNTER — Telehealth: Payer: Self-pay | Admitting: Physician Assistant

## 2016-06-18 NOTE — Telephone Encounter (Signed)
Will forward to MD, not sure if he qualifies for assistance, need financials

## 2016-06-18 NOTE — Telephone Encounter (Signed)
Pt is unable to afford the medication they switched him to in the hospital. They have put him on ranolazine (RANEXA) 500 MG 12 hr tablet [[id number]] , pt's currently not taking anything at this time

## 2016-06-18 NOTE — Telephone Encounter (Signed)
Can check with Ranexa Connect program which provides patient assistance.

## 2016-06-23 NOTE — Telephone Encounter (Signed)
Spoke with pt. States that he will not pay for Ranexa at current cost of $ 200 per 30 days. Offered to enroll pt in [employer], pt refused and states that he did not want to bring in his proof on income. He states that he can afford the medication but does not want to pay for it. Dr. Bronson Ing notified.

## 2016-07-01 ENCOUNTER — Ambulatory Visit (INDEPENDENT_AMBULATORY_CARE_PROVIDER_SITE_OTHER): Payer: Medicare HMO | Admitting: Physician Assistant

## 2016-07-01 ENCOUNTER — Encounter: Payer: Self-pay | Admitting: Physician Assistant

## 2016-07-01 VITALS — BP 122/70 | HR 55 | Ht 71.0 in | Wt 228.0 lb

## 2016-07-01 DIAGNOSIS — R001 Bradycardia, unspecified: Secondary | ICD-10-CM | POA: Diagnosis not present

## 2016-07-01 DIAGNOSIS — F17200 Nicotine dependence, unspecified, uncomplicated: Secondary | ICD-10-CM | POA: Diagnosis not present

## 2016-07-01 DIAGNOSIS — I2510 Atherosclerotic heart disease of native coronary artery without angina pectoris: Secondary | ICD-10-CM

## 2016-07-01 DIAGNOSIS — E785 Hyperlipidemia, unspecified: Secondary | ICD-10-CM | POA: Diagnosis not present

## 2016-07-01 NOTE — Progress Notes (Signed)
Cardiology Office Note    Date:  07/01/2016   ID:  Sean Gomez, DOB 1950/04/23, MRN [id number]  PCP:  [name], MD  Cardiologist: Dr. Bronson Ing  No chief complaint on file.   History of Present Illness:  Sean Gomez is a 66 y.o. male with history of CAD status postNSTEMI receiving a DES to RCA in July 2013, COPD, hyperlipidemia, admitted with chest pain 06/16/16. Troponins were negative and echo LVEF 55-60% with possible basal inferior hypokinesis. Patient also had sinus bradycardia and beta blocker was stopped. Stress test was low risk with evidence of prior infarct but no ischemia. No indication for cath. Ranexa was given but patient says it's too expensive and will not take. He was also given a trial of Zantac for possible GI symptoms. Toprol stopped because of bradycardia in the hospital.  Patient comes in today with no further chest pain. He thinks it was all GI. Not taking Pepcid either. Quit smoking and on a vegan diet which he thinks is helping.       Past Medical History:  Diagnosis Date  . 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 Bradycardia   . Heart murmur   . Hyperlipidemia   . Myocardial infarct ([place])   . Tobacco abuse    60 pack years; 1.5 pack per day    Past Surgical History:  Procedure Laterality Date  . APPENDECTOMY    . CORONARY STENT PLACEMENT    . LEFT HEART CATHETERIZATION WITH CORONARY ANGIOGRAM N/A 09/11/2011   Procedure: LEFT HEART CATHETERIZATION WITH CORONARY ANGIOGRAM;  Surgeon: Sherren Mocha, MD;  Location: [facility] CATH LAB;  Service: Cardiovascular;  Laterality: N/A;  . PERCUTANEOUS CORONARY STENT INTERVENTION (PCI-S) Right 09/11/2011   Procedure: PERCUTANEOUS CORONARY STENT INTERVENTION (PCI-S);  Surgeon: Sherren Mocha, MD;  Location: [facility] CATH LAB;  Service:  Cardiovascular;  Laterality: Right;    Current Medications: Outpatient Medications Prior to Visit  Medication Sig Dispense Refill  . Ascorbic Acid (VITAMIN C) 500 MG CAPS Take 2,000 mg by mouth daily.    Marland Kitchen aspirin 81 MG tablet Take 81 mg by mouth daily.    . fenofibrate 160 MG tablet Take 160 mg by mouth daily.    Marland Kitchen NITROSTAT 0.4 MG SL tablet PLACE 1 TABLET UNDER TONGUE EVERY 5 MINUTES FOR 3 DOSES AS NEEDED FOR CHEST PAIN 25 tablet 4  . pravastatin (PRAVACHOL) 80 MG tablet Take 80 mg by mouth daily.    . famotidine (PEPCID) 20 MG tablet Take 1 tablet (20 mg total) by mouth 2 (two) times daily. 60 tablet 0  . ranolazine (RANEXA) 500 MG 12 hr tablet Take 1 tablet (500 mg total) by mouth 2 (two) times daily. 60 tablet 0   No facility-administered medications prior to visit.      Allergies:   Atorvastatin; Sulfa antibiotics; and Penicillins   Social History   Social History  . Marital status: Married    Spouse name: N/A  . Number of children: N/A  . Years of education: N/A   Occupational History  . 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 Smoking status: Former Smoker    Packs/day: 1.00    Years: 40.00    Types: Cigarettes    Start date: 03/02/1965    Quit date: 06/17/2016  .  Smokeless tobacco: Never Used  . Alcohol use No  . Drug use: No  . Sexual activity: Not Asked   Other Topics Concern  . None   Social History Narrative  . None     Family History:  The patient's   family history includes Aortic aneurysm in his mother; Stroke in his father.   ROS:   Please see the history of present illness.    Review of Systems  Constitution: Negative.  HENT: Negative.   Cardiovascular: Negative.   Respiratory: Negative.   Endocrine: Negative.   Hematologic/Lymphatic: Negative.   Musculoskeletal: Negative.   Gastrointestinal: Positive for flatus and heartburn.  Genitourinary: Negative.   Neurological: Negative.    All other systems  reviewed and are negative.   PHYSICAL EXAM:   VS:  BP 122/70   Pulse (!) 55   Ht 5\' 11"  (1.803 m)   Wt 228 lb (103.4 kg)   SpO2 97%   BMI 31.80 kg/m   Physical Exam  GEN: Well nourished, well developed, in no acute distress  Neck: no JVD, carotid bruits, or masses Cardiac:RRR; no murmurs, rubs, or gallops  Respiratory:  clear to auscultation bilaterally, normal work of breathing GI: soft, nontender, nondistended, + BS Ext: without cyanosis, clubbing, or edema, Good distal pulses bilaterally Psych: euthymic mood, full affect  Wt Readings from Last 3 Encounters:  07/01/16 228 lb (103.4 kg)  06/16/16 226 lb 13.7 oz (102.9 kg)  07/24/15 207 lb (93.9 kg)      Studies/Labs Reviewed:   EKG:  EKG is not ordered today.    Recent Labs: 06/16/2016: BUN 15; Creatinine, Ser 0.89; Hemoglobin 12.5; Platelets 178; Potassium 4.2; Sodium 139 06/17/2016: TSH 2.394   Lipid Panel    Component Value Date/Time   CHOL 161 12/29/2011 0840   TRIG 269 (H) 12/29/2011 0840   HDL 28 (L) 12/29/2011 0840   CHOLHDL 5.8 12/29/2011 0840   VLDL 54 (H) 12/29/2011 0840   LDLCALC 79 12/29/2011 0840    Additional studies/ records that were reviewed today include:  2-D echo 4/17/18Study Conclusions   - Left ventricle: The cavity size was normal. Wall thickness was   increased in a pattern of mild LVH. Systolic function was normal.   The estimated ejection fraction was in the range of 55% to 60%.   Probable hypokinesis of the basalinferior myocardium. Left   ventricular diastolic function parameters were normal. - Aortic valve: Mildly calcified annulus. Trileaflet. - Mitral valve: There was trivial regurgitation. - Right atrium: Central venous pressure (est): 3 mm Hg. - Tricuspid valve: There was trivial regurgitation. - Pulmonary arteries: Systolic pressure could not be accurately   estimated. - Pericardium, extracardiac: There was no pericardial effusion.   Impressions:   - Mild LVH with LVEF  55-60% and probable basal inferior   hypokinesis. Grossly normal diastolic function. Trivial mitral   regurgitation. Mildly sclerotic aortic annulus. Trivial tricuspid   regurgitation.    Nuclear stress test 4/18/18Blood pressure demonstrated a normal response to exercise.  There was no ST segment deviation noted during stress. Normal chronotropic response to exercise.  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 There is no active ischemia.  This is a low risk study.  The left ventricular ejection fraction is normal (55-65%).  Duke treadmill score of 9, consistent with low risk for major cardiac events.   08/2011 cath: LM patent, LAD patent, LCX patent, RCA occluded with heavy thrombus burden. S/p DES Jan 2016 Nuclear stress: small inferolateral defect  likely subdiaphragmatic attenuation, cannot exlcude mild area of ischemia, low risk. Duke treadmill score 6.  09/2012 stress echo: no ischemia K 4.2 Cr 0.89, Hgb 12.5, Plt 178,  Trop neg x 3 EKG SR, RBBB     ASSESSMENT:    1. Arteriosclerotic cardiovascular disease (ASCVD)   2. Hyperlipidemia, unspecified hyperlipidemia type   3. Tobacco use disorder   4. Sinus bradycardia      PLAN:  In order of problems listed above:  CAD with previous NSTEMI  DES RCA 08/2011,recent hospitalization for chest pain ruled out for an MI. Nuclear stress test low risk and no evidence of ischemia. Patient says he can't afford to pay for Ranexa so is not taking it.Patient feeling much better off his medications. Continue pravastatin, fenofibrate and aspirin. Follow-up with Dr.Koneswaran in 6 months.  Hyperlipidemia continue pravastatin  Tobacco abuse patient quit smoking  Sinus bradycardia has resolved off beta blocker. Continue to monitor.    Medication Adjustments/Labs and Tests Ordered: Current medicines are reviewed at length with the patient today.  Concerns regarding medicines are outlined above.  Medication  changes, Labs and Tests ordered today are listed in the Patient Instructions below. Patient Instructions  Medication Instructions:  Your physician recommends that you continue on your current medications as directed. Please refer to the Current Medication list given to you today.   Labwork: NONE  Testing/Procedures: NONE  Follow-Up: Your physician wants you to follow-up in: 6 MONTHS .  You will receive a reminder letter in the mail two months in advance. If you don't receive a letter, please call our office to schedule the follow-up appointment.   Any Other Special Instructions Will Be Listed Below (If Applicable).     If you need a refill on your cardiac medications before your next appointment, please call your pharmacy.      Sumner Boast, PA-C  07/01/2016 11:52 AM    [facility] [address], [place], [place]  [address] Phone: [phone]; Fax: [phone]

## 2016-07-01 NOTE — Patient Instructions (Signed)

## 2016-07-09 DIAGNOSIS — E669 Obesity, unspecified: Secondary | ICD-10-CM | POA: Diagnosis not present

## 2016-07-09 DIAGNOSIS — Z87891 Personal history of nicotine dependence: Secondary | ICD-10-CM | POA: Diagnosis not present

## 2016-07-09 DIAGNOSIS — N529 Male erectile dysfunction, unspecified: Secondary | ICD-10-CM | POA: Diagnosis not present

## 2016-07-09 DIAGNOSIS — E8881 Metabolic syndrome: Secondary | ICD-10-CM | POA: Diagnosis not present

## 2016-07-09 DIAGNOSIS — Z9289 Personal history of other medical treatment: Secondary | ICD-10-CM | POA: Diagnosis not present

## 2016-07-09 DIAGNOSIS — Z6832 Body mass index (BMI) 32.0-32.9, adult: Secondary | ICD-10-CM | POA: Diagnosis not present

## 2016-07-09 DIAGNOSIS — I2510 Atherosclerotic heart disease of native coronary artery without angina pectoris: Secondary | ICD-10-CM | POA: Diagnosis not present

## 2016-07-09 DIAGNOSIS — E785 Hyperlipidemia, unspecified: Secondary | ICD-10-CM | POA: Diagnosis not present

## 2016-07-09 DIAGNOSIS — E119 Type 2 diabetes mellitus without complications: Secondary | ICD-10-CM | POA: Diagnosis not present

## 2016-12-03 ENCOUNTER — Telehealth: Payer: Self-pay | Admitting: Cardiovascular Disease

## 2016-12-03 MED ORDER — SILDENAFIL CITRATE 100 MG PO TABS
ORAL_TABLET | ORAL | 1 refills | Status: DC
Start: 2016-12-03 — End: 2022-12-16

## 2016-12-03 NOTE — Telephone Encounter (Signed)
I will forward to Dr Bronson Ing for [place]

## 2016-12-03 NOTE — Telephone Encounter (Signed)
E-scribed Sildenafil to [facility] [place], LM for pt to call back

## 2016-12-03 NOTE — Telephone Encounter (Signed)
That would be fine 

## 2016-12-03 NOTE — Telephone Encounter (Signed)
Per phone call from pt--states that he was given a Rx for Viagra, but he never got it filled. Would like to know if he could get a new Rx for the generic and have it sent to [facility] in [place].

## 2016-12-28 DIAGNOSIS — R062 Wheezing: Secondary | ICD-10-CM | POA: Diagnosis not present

## 2016-12-28 DIAGNOSIS — J209 Acute bronchitis, unspecified: Secondary | ICD-10-CM | POA: Diagnosis not present

## 2016-12-28 DIAGNOSIS — F17210 Nicotine dependence, cigarettes, uncomplicated: Secondary | ICD-10-CM | POA: Diagnosis not present

## 2017-01-01 ENCOUNTER — Ambulatory Visit: Payer: Medicare HMO | Admitting: Cardiovascular Disease

## 2017-01-11 DIAGNOSIS — R062 Wheezing: Secondary | ICD-10-CM | POA: Diagnosis not present

## 2017-01-11 DIAGNOSIS — F17210 Nicotine dependence, cigarettes, uncomplicated: Secondary | ICD-10-CM | POA: Diagnosis not present

## 2017-01-11 DIAGNOSIS — J209 Acute bronchitis, unspecified: Secondary | ICD-10-CM | POA: Diagnosis not present

## 2017-01-19 ENCOUNTER — Encounter: Payer: Self-pay | Admitting: Cardiovascular Disease

## 2017-01-19 ENCOUNTER — Ambulatory Visit: Payer: Medicare HMO | Admitting: Cardiovascular Disease

## 2017-01-19 VITALS — BP 110/66 | HR 74 | Ht 71.0 in | Wt 230.0 lb

## 2017-01-19 DIAGNOSIS — E785 Hyperlipidemia, unspecified: Secondary | ICD-10-CM

## 2017-01-19 DIAGNOSIS — Z955 Presence of coronary angioplasty implant and graft: Secondary | ICD-10-CM | POA: Diagnosis not present

## 2017-01-19 DIAGNOSIS — I25118 Atherosclerotic heart disease of native coronary artery with other forms of angina pectoris: Secondary | ICD-10-CM | POA: Diagnosis not present

## 2017-01-19 DIAGNOSIS — I252 Old myocardial infarction: Secondary | ICD-10-CM

## 2017-01-19 DIAGNOSIS — Z716 Tobacco abuse counseling: Secondary | ICD-10-CM

## 2017-01-19 NOTE — Patient Instructions (Signed)
Your physician wants you to follow-up in:  1 year with Dr.Koneswaran You will receive a reminder letter in the mail two months in advance. If you don't receive a letter, please call our office to schedule the follow-up appointment.    Your physician recommends that you continue on your current medications as directed. Please refer to the Current Medication list given to you today.    If you need a refill on your cardiac medications before your next appointment, please call your pharmacy.      No lab work or tests ordered today.      Thank you for choosing [facility] !        

## 2017-01-19 NOTE — Progress Notes (Signed)
SUBJECTIVE: The patient presents for routine cardiovascular follow-up.  He has a history of non-STEMI with a stent to the RCA.  He underwent a low risk nuclear stress test earlier this year (06/17/16) when he was hospitalized with chest pain.  It demonstrated a prior small inferior MI with no evidence of ischemia.   Echocardiogram demonstrated normal left ventricular systolic function, LVEF 24-58%, mild LVH, probable basal inferior hypokinesis, and grossly normal diastolic function.  He had some bradycardia and beta-blocker was discontinued.  He has felt better since then.  He denies chest pain, leg swelling, and palpitations.  He has gone back to smoking.  He has put on a considerable amount of weight and has stopped exercising.  We talked about all of these issues at length.  He said, "I have been a smoker all of my life.  If it kills me, oh well ".    Review of Systems: As per "subjective", otherwise negative.  Allergies  Allergen Reactions  . Atorvastatin Other (See Comments)    myalgias  . Sulfa Antibiotics   . Penicillins Rash    Childhood allergy. Has patient had a PCN reaction causing immediate rash, facial/tongue/throat swelling, SOB or lightheadedness with hypotension: unknown Has patient had a PCN reaction causing severe rash involving mucus membranes or skin necrosis: unknown Has patient had a PCN reaction that required hospitalization: no Has patient had a PCN reaction occurring within the last 10 years: no If all of the above answers are "NO", then may proceed with Cephalosporin use.     Current Outpatient Medications  Medication Sig Dispense Refill  . Ascorbic Acid (VITAMIN C) 500 MG CAPS Take 2,000 mg by mouth daily.    Marland Kitchen aspirin 81 MG tablet Take 81 mg by mouth daily.    . fenofibrate 160 MG tablet Take 160 mg by mouth daily.    Marland Kitchen NITROSTAT 0.4 MG SL tablet PLACE 1 TABLET UNDER TONGUE EVERY 5 MINUTES FOR 3 DOSES AS NEEDED FOR CHEST PAIN 25 tablet 4  .  pravastatin (PRAVACHOL) 80 MG tablet Take 80 mg by mouth daily.    . sildenafil (VIAGRA) 100 MG tablet Take 1/2 tablet (50 mg) 30 minutes prior to sexual activity 10 tablet 1   No current facility-administered medications for this visit.     Past Medical History:  Diagnosis Date  . 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 Bradycardia   . Heart murmur   . Hyperlipidemia   . Myocardial infarct ([place])   . Tobacco abuse    60 pack years; 1.5 pack per day    Past Surgical History:  Procedure Laterality Date  . APPENDECTOMY    . CORONARY STENT PLACEMENT    . LEFT HEART CATHETERIZATION WITH CORONARY ANGIOGRAM N/A 09/11/2011   Procedure: LEFT HEART CATHETERIZATION WITH CORONARY ANGIOGRAM;  Surgeon: Sherren Mocha, MD;  Location: [facility] CATH LAB;  Service: Cardiovascular;  Laterality: N/A;  . PERCUTANEOUS CORONARY STENT INTERVENTION (PCI-S) Right 09/11/2011   Procedure: PERCUTANEOUS CORONARY STENT INTERVENTION (PCI-S);  Surgeon: Sherren Mocha, MD;  Location: [facility] CATH LAB;  Service: Cardiovascular;  Laterality: Right;    Social History   Socioeconomic History  . Marital status: Married    Spouse name: Not on file  . Number of children: Not on file  . Years of education: Not on file  .  Highest education level: Not on file  Social Needs  . Financial resource strain: Not on file  . Food insecurity - worry: Not on file  . Food insecurity - inability: Not on file  . Transportation needs - medical: Not on file  . Transportation needs - non-medical: Not on file  Occupational History  . 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 Smoking status: Former Smoker    Packs/day: 1.00    Years: 40.00    Pack years: 40.00    Types: Cigarettes    Start date: 03/02/1965    Last attempt  to quit: 06/17/2016    Years since quitting: 0.5  . Smokeless tobacco: Never Used  Substance and Sexual Activity  . Alcohol use: No    Alcohol/week: 0.0 oz  . Drug use: No  . Sexual activity: Not on file  Other Topics Concern  . Not on file  Social History Narrative  . Not on file     Vitals:   01/19/17 1553  BP: 110/66  Pulse: 74  SpO2: 98%  Weight: 230 lb (104.3 kg)  Height: 5\' 11"  (1.803 m)    Wt Readings from Last 3 Encounters:  01/19/17 230 lb (104.3 kg)  07/01/16 228 lb (103.4 kg)  06/16/16 226 lb 13.7 oz (102.9 kg)     PHYSICAL EXAM General: NAD HEENT: Normal. Neck: No JVD, no thyromegaly. Lungs: No crackles or wheezes.   CV: Regular rate and rhythm, normal S1/S2, no S3/S4, no murmur. No pretibial or periankle edema.  No carotid bruit.   Abdomen: Soft, nontender, no distention.  Neurologic: Alert and oriented.  Psych: Normal affect. Skin: Normal. Musculoskeletal: No gross deformities.    ECG: Most recent ECG reviewed.   Labs: Lab Results  Component Value Date/Time   K 4.2 06/16/2016 12:10 AM   BUN 15 06/16/2016 12:10 AM   CREATININE 0.89 06/16/2016 12:10 AM   CREATININE 0.85 09/22/2011 01:38 PM   ALT 15 03/22/2012 11:19 AM   TSH 2.394 06/17/2016 05:44 AM   TSH 1.349 09/11/2011 09:58 PM   HGB 12.5 (L) 06/16/2016 12:10 AM     Lipids: Lab Results  Component Value Date/Time   LDLCALC 79 12/29/2011 08:40 AM   CHOL 161 12/29/2011 08:40 AM   TRIG 269 (H) 12/29/2011 08:40 AM   HDL 28 (L) 12/29/2011 08:40 AM       ASSESSMENT AND PLAN: 1.  Coronary artery disease with RCA stent: Symptomatically stable with low risk nuclear stress test earlier this year.  No changes to therapy.  Continue aspirin and statin.  He has felt better being off of a beta-blocker.  I talked to him about tobacco cessation and increased exercise.  2.  Hypercholesterolemia: Continue statin therapy.  3.  Tobacco abuse: Cessation counseling provided.  He is not interested in  quitting.   Disposition: Follow up 1 year   Kate Sable, M.D., F.A.C.C.

## 2017-05-10 DIAGNOSIS — R319 Hematuria, unspecified: Secondary | ICD-10-CM | POA: Diagnosis not present

## 2017-05-10 DIAGNOSIS — F17210 Nicotine dependence, cigarettes, uncomplicated: Secondary | ICD-10-CM | POA: Diagnosis not present

## 2017-06-17 DIAGNOSIS — R310 Gross hematuria: Secondary | ICD-10-CM | POA: Diagnosis not present

## 2017-06-17 DIAGNOSIS — N400 Enlarged prostate without lower urinary tract symptoms: Secondary | ICD-10-CM | POA: Diagnosis not present

## 2017-06-25 DIAGNOSIS — R310 Gross hematuria: Secondary | ICD-10-CM | POA: Diagnosis not present

## 2017-07-01 DIAGNOSIS — C674 Malignant neoplasm of posterior wall of bladder: Secondary | ICD-10-CM | POA: Diagnosis not present

## 2017-07-01 DIAGNOSIS — R310 Gross hematuria: Secondary | ICD-10-CM | POA: Diagnosis not present

## 2017-07-06 DIAGNOSIS — Z1211 Encounter for screening for malignant neoplasm of colon: Secondary | ICD-10-CM | POA: Diagnosis not present

## 2017-07-23 DIAGNOSIS — R9341 Abnormal radiologic findings on diagnostic imaging of renal pelvis, ureter, or bladder: Secondary | ICD-10-CM | POA: Diagnosis not present

## 2017-07-23 DIAGNOSIS — N398 Other specified disorders of urinary system: Secondary | ICD-10-CM | POA: Diagnosis not present

## 2017-07-23 DIAGNOSIS — C672 Malignant neoplasm of lateral wall of bladder: Secondary | ICD-10-CM | POA: Diagnosis not present

## 2017-07-23 DIAGNOSIS — C679 Malignant neoplasm of bladder, unspecified: Secondary | ICD-10-CM | POA: Diagnosis not present

## 2017-07-23 DIAGNOSIS — D494 Neoplasm of unspecified behavior of bladder: Secondary | ICD-10-CM | POA: Diagnosis not present

## 2017-08-06 DIAGNOSIS — C674 Malignant neoplasm of posterior wall of bladder: Secondary | ICD-10-CM | POA: Diagnosis not present

## 2017-08-23 DIAGNOSIS — C674 Malignant neoplasm of posterior wall of bladder: Secondary | ICD-10-CM | POA: Diagnosis not present

## 2017-08-23 DIAGNOSIS — Z5111 Encounter for antineoplastic chemotherapy: Secondary | ICD-10-CM | POA: Diagnosis not present

## 2017-08-30 DIAGNOSIS — Z5111 Encounter for antineoplastic chemotherapy: Secondary | ICD-10-CM | POA: Diagnosis not present

## 2017-08-30 DIAGNOSIS — C674 Malignant neoplasm of posterior wall of bladder: Secondary | ICD-10-CM | POA: Diagnosis not present

## 2017-09-06 DIAGNOSIS — C674 Malignant neoplasm of posterior wall of bladder: Secondary | ICD-10-CM | POA: Diagnosis not present

## 2017-09-13 DIAGNOSIS — C674 Malignant neoplasm of posterior wall of bladder: Secondary | ICD-10-CM | POA: Diagnosis not present

## 2017-09-20 DIAGNOSIS — Z5111 Encounter for antineoplastic chemotherapy: Secondary | ICD-10-CM | POA: Diagnosis not present

## 2017-09-20 DIAGNOSIS — C674 Malignant neoplasm of posterior wall of bladder: Secondary | ICD-10-CM | POA: Diagnosis not present

## 2017-09-27 DIAGNOSIS — Z5111 Encounter for antineoplastic chemotherapy: Secondary | ICD-10-CM | POA: Diagnosis not present

## 2017-09-27 DIAGNOSIS — C674 Malignant neoplasm of posterior wall of bladder: Secondary | ICD-10-CM | POA: Diagnosis not present

## 2017-10-04 DIAGNOSIS — C679 Malignant neoplasm of bladder, unspecified: Secondary | ICD-10-CM | POA: Diagnosis not present

## 2017-10-04 DIAGNOSIS — Z125 Encounter for screening for malignant neoplasm of prostate: Secondary | ICD-10-CM | POA: Diagnosis not present

## 2017-10-04 DIAGNOSIS — E785 Hyperlipidemia, unspecified: Secondary | ICD-10-CM | POA: Diagnosis not present

## 2017-10-04 DIAGNOSIS — I2510 Atherosclerotic heart disease of native coronary artery without angina pectoris: Secondary | ICD-10-CM | POA: Diagnosis not present

## 2017-10-04 DIAGNOSIS — E8881 Metabolic syndrome: Secondary | ICD-10-CM | POA: Diagnosis not present

## 2017-10-04 DIAGNOSIS — E782 Mixed hyperlipidemia: Secondary | ICD-10-CM | POA: Diagnosis not present

## 2017-10-04 DIAGNOSIS — E119 Type 2 diabetes mellitus without complications: Secondary | ICD-10-CM | POA: Diagnosis not present

## 2017-10-04 DIAGNOSIS — E669 Obesity, unspecified: Secondary | ICD-10-CM | POA: Diagnosis not present

## 2017-10-04 DIAGNOSIS — F17200 Nicotine dependence, unspecified, uncomplicated: Secondary | ICD-10-CM | POA: Diagnosis not present

## 2017-10-06 DIAGNOSIS — R21 Rash and other nonspecific skin eruption: Secondary | ICD-10-CM | POA: Diagnosis not present

## 2017-11-17 DIAGNOSIS — C674 Malignant neoplasm of posterior wall of bladder: Secondary | ICD-10-CM | POA: Diagnosis not present

## 2017-12-08 DIAGNOSIS — Z5111 Encounter for antineoplastic chemotherapy: Secondary | ICD-10-CM | POA: Diagnosis not present

## 2017-12-08 DIAGNOSIS — C674 Malignant neoplasm of posterior wall of bladder: Secondary | ICD-10-CM | POA: Diagnosis not present

## 2017-12-15 DIAGNOSIS — Z5111 Encounter for antineoplastic chemotherapy: Secondary | ICD-10-CM | POA: Diagnosis not present

## 2017-12-15 DIAGNOSIS — C674 Malignant neoplasm of posterior wall of bladder: Secondary | ICD-10-CM | POA: Diagnosis not present

## 2017-12-22 DIAGNOSIS — C674 Malignant neoplasm of posterior wall of bladder: Secondary | ICD-10-CM | POA: Diagnosis not present

## 2017-12-22 DIAGNOSIS — Z5111 Encounter for antineoplastic chemotherapy: Secondary | ICD-10-CM | POA: Diagnosis not present

## 2017-12-29 DIAGNOSIS — H25813 Combined forms of age-related cataract, bilateral: Secondary | ICD-10-CM | POA: Diagnosis not present

## 2017-12-29 DIAGNOSIS — E119 Type 2 diabetes mellitus without complications: Secondary | ICD-10-CM | POA: Diagnosis not present

## 2017-12-29 DIAGNOSIS — H5203 Hypermetropia, bilateral: Secondary | ICD-10-CM | POA: Diagnosis not present

## 2017-12-29 DIAGNOSIS — H52223 Regular astigmatism, bilateral: Secondary | ICD-10-CM | POA: Diagnosis not present

## 2018-02-04 DIAGNOSIS — E785 Hyperlipidemia, unspecified: Secondary | ICD-10-CM | POA: Diagnosis not present

## 2018-02-04 DIAGNOSIS — E8881 Metabolic syndrome: Secondary | ICD-10-CM | POA: Diagnosis not present

## 2018-02-04 DIAGNOSIS — E119 Type 2 diabetes mellitus without complications: Secondary | ICD-10-CM | POA: Diagnosis not present

## 2018-02-04 DIAGNOSIS — I2510 Atherosclerotic heart disease of native coronary artery without angina pectoris: Secondary | ICD-10-CM | POA: Diagnosis not present

## 2018-02-04 DIAGNOSIS — F17200 Nicotine dependence, unspecified, uncomplicated: Secondary | ICD-10-CM | POA: Diagnosis not present

## 2018-02-04 DIAGNOSIS — C679 Malignant neoplasm of bladder, unspecified: Secondary | ICD-10-CM | POA: Diagnosis not present

## 2018-02-04 DIAGNOSIS — E669 Obesity, unspecified: Secondary | ICD-10-CM | POA: Diagnosis not present

## 2018-02-16 DIAGNOSIS — C674 Malignant neoplasm of posterior wall of bladder: Secondary | ICD-10-CM | POA: Diagnosis not present

## 2018-02-22 ENCOUNTER — Emergency Department (HOSPITAL_COMMUNITY): Payer: Medicare HMO

## 2018-02-22 ENCOUNTER — Encounter (HOSPITAL_COMMUNITY): Payer: Self-pay | Admitting: Emergency Medicine

## 2018-02-22 ENCOUNTER — Other Ambulatory Visit: Payer: Self-pay

## 2018-02-22 ENCOUNTER — Emergency Department (HOSPITAL_COMMUNITY)
Admission: EM | Admit: 2018-02-22 | Discharge: 2018-02-22 | Disposition: A | Discharge status: Home / Self Care | Payer: Medicare HMO | Attending: Emergency Medicine | Admitting: Emergency Medicine

## 2018-02-22 DIAGNOSIS — Z79899 Other long term (current) drug therapy: Secondary | ICD-10-CM | POA: Insufficient documentation

## 2018-02-22 DIAGNOSIS — Z7982 Long term (current) use of aspirin: Secondary | ICD-10-CM | POA: Diagnosis not present

## 2018-02-22 DIAGNOSIS — R21 Rash and other nonspecific skin eruption: Secondary | ICD-10-CM | POA: Diagnosis present

## 2018-02-22 DIAGNOSIS — I2510 Atherosclerotic heart disease of native coronary artery without angina pectoris: Secondary | ICD-10-CM | POA: Insufficient documentation

## 2018-02-22 DIAGNOSIS — I4510 Unspecified right bundle-branch block: Secondary | ICD-10-CM | POA: Diagnosis not present

## 2018-02-22 DIAGNOSIS — F17210 Nicotine dependence, cigarettes, uncomplicated: Secondary | ICD-10-CM | POA: Diagnosis not present

## 2018-02-22 DIAGNOSIS — I252 Old myocardial infarction: Secondary | ICD-10-CM | POA: Insufficient documentation

## 2018-02-22 DIAGNOSIS — J449 Chronic obstructive pulmonary disease, unspecified: Secondary | ICD-10-CM | POA: Diagnosis not present

## 2018-02-22 DIAGNOSIS — T7840XA Allergy, unspecified, initial encounter: Secondary | ICD-10-CM | POA: Diagnosis not present

## 2018-02-22 DIAGNOSIS — Z8551 Personal history of malignant neoplasm of bladder: Secondary | ICD-10-CM | POA: Insufficient documentation

## 2018-02-22 DIAGNOSIS — T781XXA Other adverse food reactions, not elsewhere classified, initial encounter: Secondary | ICD-10-CM | POA: Diagnosis not present

## 2018-02-22 DIAGNOSIS — S299XXA Unspecified injury of thorax, initial encounter: Secondary | ICD-10-CM | POA: Diagnosis not present

## 2018-02-22 HISTORY — DX: Malignant (primary) neoplasm, unspecified: C80.1

## 2018-02-22 LAB — I-STAT CHEM 8, ED
BUN: 13 mg/dL (ref 8–23)
CALCIUM ION: 1.15 mmol/L (ref 1.15–1.40)
CHLORIDE: 104 mmol/L (ref 98–111)
Creatinine, Ser: 1.2 mg/dL (ref 0.61–1.24)
GLUCOSE: 124 mg/dL — AB (ref 70–99)
HCT: 39 % (ref 39.0–52.0)
Hemoglobin: 13.3 g/dL (ref 13.0–17.0)
Potassium: 3.4 mmol/L — ABNORMAL LOW (ref 3.5–5.1)
Sodium: 140 mmol/L (ref 135–145)
TCO2: 25 mmol/L (ref 22–32)

## 2018-02-22 LAB — CBC WITH DIFFERENTIAL/PLATELET
ABS IMMATURE GRANULOCYTES: 0.02 10*3/uL (ref 0.00–0.07)
Basophils Absolute: 0 10*3/uL (ref 0.0–0.1)
Basophils Relative: 0 %
Eosinophils Absolute: 0.1 10*3/uL (ref 0.0–0.5)
Eosinophils Relative: 1 %
HEMATOCRIT: 39.7 % (ref 39.0–52.0)
Hemoglobin: 13.2 g/dL (ref 13.0–17.0)
IMMATURE GRANULOCYTES: 0 %
LYMPHS ABS: 3.6 10*3/uL (ref 0.7–4.0)
Lymphocytes Relative: 50 %
MCH: 31.1 pg (ref 26.0–34.0)
MCHC: 33.2 g/dL (ref 30.0–36.0)
MCV: 93.4 fL (ref 80.0–100.0)
Monocytes Absolute: 0.5 10*3/uL (ref 0.1–1.0)
Monocytes Relative: 7 %
NEUTROS ABS: 3 10*3/uL (ref 1.7–7.7)
NEUTROS PCT: 42 %
Platelets: 243 10*3/uL (ref 150–400)
RBC: 4.25 MIL/uL (ref 4.22–5.81)
RDW: 12 % (ref 11.5–15.5)
WBC: 7.2 10*3/uL (ref 4.0–10.5)
nRBC: 0 % (ref 0.0–0.2)

## 2018-02-22 LAB — I-STAT TROPONIN, ED: TROPONIN I, POC: 0.01 ng/mL (ref 0.00–0.08)

## 2018-02-22 MED ORDER — DIPHENHYDRAMINE HCL 50 MG/ML IJ SOLN
25.0000 mg | Freq: Once | INTRAMUSCULAR | Status: AC
  Administered 2018-02-22: 25 mg via INTRAVENOUS
  Filled 2018-02-22: qty 1

## 2018-02-22 MED ORDER — SODIUM CHLORIDE 0.9 % IV BOLUS
2000.0000 mL | Freq: Once | INTRAVENOUS | Status: AC
  Administered 2018-02-22: 2000 mL via INTRAVENOUS

## 2018-02-22 MED ORDER — EPINEPHRINE 0.3 MG/0.3ML IJ SOAJ
0.3000 mg | Freq: Once | INTRAMUSCULAR | Status: AC
  Administered 2018-02-22: 0.3 mg via INTRAMUSCULAR

## 2018-02-22 MED ORDER — FAMOTIDINE 20 MG PO TABS: 20.0000 mg | ORAL_TABLET | Freq: Two times a day (BID) | ORAL | 0 refills | Status: DC

## 2018-02-22 MED ORDER — EPINEPHRINE 0.3 MG/0.3ML IJ SOAJ
INTRAMUSCULAR | Status: AC
  Administered 2018-02-22: 0.3 mg via INTRAMUSCULAR
  Filled 2018-02-22: qty 0.3

## 2018-02-22 MED ORDER — FAMOTIDINE IN NACL 20-0.9 MG/50ML-% IV SOLN
INTRAVENOUS | Status: AC
  Administered 2018-02-22: 20 mg via INTRAVENOUS
  Filled 2018-02-22: qty 50

## 2018-02-22 MED ORDER — EPINEPHRINE 0.3 MG/0.3ML IJ SOAJ: INTRAMUSCULAR | 0 refills | Status: AC

## 2018-02-22 MED ORDER — FAMOTIDINE IN NACL 20-0.9 MG/50ML-% IV SOLN
20.0000 mg | Freq: Once | INTRAVENOUS | Status: AC
  Administered 2018-02-22: 20 mg via INTRAVENOUS

## 2018-02-22 MED ORDER — METHYLPREDNISOLONE SODIUM SUCC 125 MG IJ SOLR
125.0000 mg | Freq: Once | INTRAMUSCULAR | Status: AC
  Administered 2018-02-22: 125 mg via INTRAVENOUS
  Filled 2018-02-22: qty 2

## 2018-02-22 MED ORDER — PREDNISONE 20 MG PO TABS: ORAL_TABLET | ORAL | 0 refills | Status: DC

## 2018-02-22 NOTE — ED Provider Notes (Signed)
[facility] EMERGENCY DEPARTMENT Provider Note   CSN: [id number] Arrival date & time: 02/22/18  2103     History   Chief Complaint Chief Complaint  Patient presents with  . Allergic Reaction    HPI Sean Gomez is a 67 y.o. male.  Patient states he was eating something and then started get a rash and felt dizzy and weak patient does not have a history of allergies  The history is provided by the patient. No language interpreter was used.  Allergic Reaction  Presenting symptoms: itching and rash   Presenting symptoms: no difficulty breathing   Severity:  Moderate Prior allergic episodes:  No prior episodes Context: food   Relieved by:  Nothing Worsened by:  Nothing Ineffective treatments:  None tried   Past Medical History:  Diagnosis Date  . 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 Bradycardia   . Cancer ([place])    bladder  . Heart murmur   . Hyperlipidemia   . Myocardial infarct ([place])   . Tobacco abuse    60 pack years; 1.5 pack per day    Patient Active Problem List   Diagnosis Date Noted  . Sinus bradycardia 07/01/2016  . Chest pain 06/16/2016  . Tobacco use disorder 06/16/2016  . COPD GOLD 0  12/14/2012  . Multiple pulmonary nodules 09/28/2012  . Hx of diagnostic tests 12/31/2011  . Cigarette smoker   . Hyperlipidemia 09/12/2011  . Arteriosclerotic cardiovascular disease (ASCVD) 08/31/2011    Past Surgical History:  Procedure Laterality Date  . APPENDECTOMY    . CORONARY STENT PLACEMENT    . LEFT HEART CATHETERIZATION WITH CORONARY ANGIOGRAM N/A 09/11/2011   Procedure: LEFT HEART CATHETERIZATION WITH CORONARY ANGIOGRAM;  Surgeon: Sherren Mocha, MD;  Location: [facility] CATH LAB;  Service: Cardiovascular;  Laterality: N/A;  . PERCUTANEOUS CORONARY STENT INTERVENTION (PCI-S) Right  09/11/2011   Procedure: PERCUTANEOUS CORONARY STENT INTERVENTION (PCI-S);  Surgeon: Sherren Mocha, MD;  Location: [facility] CATH LAB;  Service: Cardiovascular;  Laterality: Right;        Home Medications    Prior to Admission medications   Medication Sig Start Date End Date Taking? Authorizing Provider  Ascorbic Acid (VITAMIN C) 500 MG CAPS Take 2,000 mg by mouth daily.   Yes [provider]  aspirin 81 MG tablet Take 81 mg by mouth daily.   Yes [provider]  diphenhydrAMINE (BENADRYL) 25 MG tablet Take 25 mg by mouth once as needed for allergies.   Yes [provider]  fenofibrate 160 MG tablet Take 160 mg by mouth daily.   Yes [provider]  Multiple Vitamin (MULTIVITAMIN WITH MINERALS) TABS tablet Take 1 tablet by mouth daily.   Yes [provider]  NITROSTAT 0.4 MG SL tablet PLACE 1 TABLET UNDER TONGUE EVERY 5 MINUTES FOR 3 DOSES AS NEEDED FOR CHEST PAIN Patient taking differently: Place 0.4 mg under the tongue every 5 (five) minutes as needed.  01/16/13  Yes Herminio Commons, MD  pravastatin (PRAVACHOL) 80 MG tablet Take 80 mg by mouth daily.   Yes [provider]  sildenafil (VIAGRA) 100 MG tablet Take 1/2 tablet (50 mg) 30 minutes prior to sexual activity 12/03/16  Yes Herminio Commons, MD  EPINEPHrine 0.3 mg/0.3 mL IJ SOAJ injection When you have an allergic reaction go ahead and give yourself 1  shot and then come to the emergency department 02/22/18   Milton Ferguson, MD  predniSONE (DELTASONE) 20 MG tablet 2 tabs po daily x 3 days 02/22/18   Milton Ferguson, MD    Family History Family History  Problem Relation Age of Onset  . Aortic aneurysm Mother   . Stroke Father   . Coronary artery disease Neg Hx     Social History Social History   Tobacco Use  . Smoking status: Current Every Day Smoker    Packs/day: 1.00    Years: 40.00    Pack years: 40.00    Types: Cigarettes    Start date: 03/02/1965    Last attempt to  quit: 06/17/2016    Years since quitting: 1.6  . Smokeless tobacco: Never Used  Substance Use Topics  . Alcohol use: No    Alcohol/week: 0.0 standard drinks  . Drug use: No     Allergies   Atorvastatin; Sulfa antibiotics; and Penicillins   Review of Systems Review of Systems  Constitutional: Negative for appetite change and fatigue.  HENT: Negative for congestion, ear discharge and sinus pressure.   Eyes: Negative for discharge.  Respiratory: Negative for cough.   Cardiovascular: Negative for chest pain.  Gastrointestinal: Negative for abdominal pain and diarrhea.  Genitourinary: Negative for frequency and hematuria.  Musculoskeletal: Negative for back pain.  Skin: Positive for itching and rash.  Neurological: Negative for seizures and headaches.  Psychiatric/Behavioral: Negative for hallucinations.     Physical Exam Updated Vital Signs BP (!) 111/58   Pulse 84   Temp 97.6 F (36.4 C) (Oral)   Resp 18   Ht 5\' 11"  (1.803 m)   Wt 95.7 kg   SpO2 97%   BMI 29.43 kg/m   Physical Exam Vitals signs reviewed.  Constitutional:      Appearance: He is well-developed.  HENT:     Head: Normocephalic.     Nose: Nose normal.  Eyes:     General: No scleral icterus.    Conjunctiva/sclera: Conjunctivae normal.  Neck:     Musculoskeletal: Neck supple.     Thyroid: No thyromegaly.  Cardiovascular:     Rate and Rhythm: Normal rate and regular rhythm.     Heart sounds: No murmur. No friction rub. No gallop.   Pulmonary:     Breath sounds: No stridor. No wheezing or rales.  Chest:     Chest wall: No tenderness.  Abdominal:     General: There is no distension.     Tenderness: There is no abdominal tenderness. There is no rebound.  Musculoskeletal: Normal range of motion.  Lymphadenopathy:     Cervical: No cervical adenopathy.  Skin:    General: Skin is warm.     Findings: No erythema or rash.     Comments: Rash to chest and abdomen  Neurological:     Mental Status: He  is oriented to person, place, and time.     Motor: No abnormal muscle tone.     Coordination: Coordination normal.  Psychiatric:        Behavior: Behavior normal.      ED Treatments / Results  Labs (all labs ordered are listed, but only abnormal results are displayed) Labs Reviewed  I-STAT CHEM 8, ED - Abnormal; Notable for the following components:      Result Value   Potassium 3.4 (*)    Glucose, Bld 124 (*)    All other components within normal limits  CBC WITH DIFFERENTIAL/PLATELET  I-STAT  TROPONIN, ED    EKG None  Radiology Dg Chest Portable 1 View  Result Date: 02/22/2018 CLINICAL DATA:  Fall dizziness and weakness beginning today. Hypertension. Previous myocardial infarct. EXAM: PORTABLE CHEST 1 VIEW COMPARISON:  06/16/2016 FINDINGS: The heart size and mediastinal contours are within normal limits. Both lungs are clear. The visualized skeletal structures are unremarkable. IMPRESSION: No active disease. Electronically Signed   By: Earle Gell M.D.   On: 02/22/2018 21:37    Procedures Procedures (including critical care time)  Medications Ordered in ED Medications  EPINEPHrine (EPI-PEN) injection 0.3 mg (0.3 mg Intramuscular Given 02/22/18 2116)  sodium chloride 0.9 % bolus 2,000 mL (0 mLs Intravenous Stopped 02/22/18 2249)  methylPREDNISolone sodium succinate (SOLU-MEDROL) 125 mg/2 mL injection 125 mg (125 mg Intravenous Given 02/22/18 2121)  diphenhydrAMINE (BENADRYL) injection 25 mg (25 mg Intravenous Given 02/22/18 2122)  famotidine (PEPCID) IVPB 20 mg premix (0 mg Intravenous Stopped 02/22/18 2250)  diphenhydrAMINE (BENADRYL) injection 25 mg (25 mg Intravenous Given 02/22/18 2141)     Initial Impression / Assessment and Plan / ED Course  I have reviewed the triage vital signs and the nursing notes.  Pertinent labs & imaging results that were available during my care of the patient were reviewed by me and considered in my medical decision making (see chart  for details).     CRITICAL CARE Performed by: Milton Ferguson Total critical care time:79minutes Critical care time was exclusive of separately billable procedures and treating other patients. Critical care was necessary to treat or prevent imminent or life-threatening deterioration. Critical care was time spent personally by me on the following activities: development of treatment plan with patient and/or surrogate as well as nursing, discussions with consultants, evaluation of patient's response to treatment, examination of patient, obtaining history from patient or surrogate, ordering and performing treatments and interventions, ordering and review of laboratory studies, ordering and review of radiographic studies, pulse oximetry and re-evaluation of patient's condition.  Patient with allergic reaction probably to food.  Patient responded to epi Benadryl Pepcid and fluids.  He will follow-up with his primary care doctor and consider having allergy testing Final Clinical Impressions(s) / ED Diagnoses   Final diagnoses:  Allergic reaction, initial encounter    ED Discharge Orders         Ordered    predniSONE (DELTASONE) 20 MG tablet     02/22/18 2253    EPINEPHrine 0.3 mg/0.3 mL IJ SOAJ injection     02/22/18 2255           Milton Ferguson, MD 02/22/18 2300

## 2018-02-22 NOTE — ED Triage Notes (Signed)
Patient reports onset of dizziness, itching, and "feeling funny" after eating tonight. Patient diaphoretic and weak on arrival. Hypotensive, physician notified.

## 2018-02-22 NOTE — Discharge Instructions (Addendum)
Take Benadryl 25 mg every 4-6 hours for rash or itching.  Follow-up with your doctor in a week for recheck.  Return if any problems.

## 2018-03-15 DIAGNOSIS — Z5111 Encounter for antineoplastic chemotherapy: Secondary | ICD-10-CM | POA: Diagnosis not present

## 2018-03-15 DIAGNOSIS — C674 Malignant neoplasm of posterior wall of bladder: Secondary | ICD-10-CM | POA: Diagnosis not present

## 2018-04-04 DIAGNOSIS — Z87892 Personal history of anaphylaxis: Secondary | ICD-10-CM | POA: Diagnosis not present

## 2018-04-04 DIAGNOSIS — J309 Allergic rhinitis, unspecified: Secondary | ICD-10-CM | POA: Diagnosis not present

## 2018-04-05 DIAGNOSIS — Z87892 Personal history of anaphylaxis: Secondary | ICD-10-CM | POA: Diagnosis not present

## 2018-04-05 DIAGNOSIS — Z91018 Allergy to other foods: Secondary | ICD-10-CM | POA: Diagnosis not present

## 2018-04-05 DIAGNOSIS — D8942 Idiopathic mast cell activation syndrome: Secondary | ICD-10-CM | POA: Diagnosis not present

## 2018-04-05 DIAGNOSIS — T781XXD Other adverse food reactions, not elsewhere classified, subsequent encounter: Secondary | ICD-10-CM | POA: Diagnosis not present

## 2018-06-03 DIAGNOSIS — E785 Hyperlipidemia, unspecified: Secondary | ICD-10-CM | POA: Diagnosis not present

## 2018-06-03 DIAGNOSIS — E669 Obesity, unspecified: Secondary | ICD-10-CM | POA: Diagnosis not present

## 2018-06-03 DIAGNOSIS — F17200 Nicotine dependence, unspecified, uncomplicated: Secondary | ICD-10-CM | POA: Diagnosis not present

## 2018-06-03 DIAGNOSIS — E119 Type 2 diabetes mellitus without complications: Secondary | ICD-10-CM | POA: Diagnosis not present

## 2018-06-03 DIAGNOSIS — C679 Malignant neoplasm of bladder, unspecified: Secondary | ICD-10-CM | POA: Diagnosis not present

## 2018-06-03 DIAGNOSIS — I2510 Atherosclerotic heart disease of native coronary artery without angina pectoris: Secondary | ICD-10-CM | POA: Diagnosis not present

## 2018-06-03 DIAGNOSIS — E8881 Metabolic syndrome: Secondary | ICD-10-CM | POA: Diagnosis not present

## 2018-07-13 DIAGNOSIS — C674 Malignant neoplasm of posterior wall of bladder: Secondary | ICD-10-CM | POA: Diagnosis not present

## 2018-09-12 DIAGNOSIS — A289 Zoonotic bacterial disease, unspecified: Secondary | ICD-10-CM | POA: Diagnosis not present

## 2018-09-14 DIAGNOSIS — A289 Zoonotic bacterial disease, unspecified: Secondary | ICD-10-CM | POA: Diagnosis not present

## 2018-09-21 DIAGNOSIS — A289 Zoonotic bacterial disease, unspecified: Secondary | ICD-10-CM | POA: Diagnosis not present

## 2018-10-12 DIAGNOSIS — C674 Malignant neoplasm of posterior wall of bladder: Secondary | ICD-10-CM | POA: Diagnosis not present

## 2018-12-06 ENCOUNTER — Telehealth: Payer: Self-pay | Admitting: Cardiovascular Disease

## 2018-12-06 MED ORDER — NITROGLYCERIN 0.4 MG SL SUBL: SUBLINGUAL_TABLET | SUBLINGUAL | 0 refills | Status: DC

## 2018-12-06 NOTE — Telephone Encounter (Signed)
Needing refill on NITROSTAT 0.4 MG SL tablet [id number]  Sent to [facility] , he's scheduled to see SK on 12/16/2018

## 2018-12-06 NOTE — Telephone Encounter (Signed)
Refill complete 

## 2018-12-10 ENCOUNTER — Emergency Department (HOSPITAL_COMMUNITY): Payer: Medicare HMO

## 2018-12-10 ENCOUNTER — Encounter (HOSPITAL_COMMUNITY): Payer: Self-pay | Admitting: Emergency Medicine

## 2018-12-10 ENCOUNTER — Other Ambulatory Visit: Payer: Self-pay

## 2018-12-10 ENCOUNTER — Emergency Department (HOSPITAL_COMMUNITY)
Admission: EM | Admit: 2018-12-10 | Discharge: 2018-12-10 | Disposition: A | Discharge status: Home / Self Care | Payer: Medicare HMO | Attending: Emergency Medicine | Admitting: Emergency Medicine

## 2018-12-10 DIAGNOSIS — R0789 Other chest pain: Secondary | ICD-10-CM | POA: Diagnosis not present

## 2018-12-10 DIAGNOSIS — R079 Chest pain, unspecified: Secondary | ICD-10-CM | POA: Diagnosis not present

## 2018-12-10 DIAGNOSIS — Z5321 Procedure and treatment not carried out due to patient leaving prior to being seen by health care provider: Secondary | ICD-10-CM | POA: Insufficient documentation

## 2018-12-10 HISTORY — DX: Other adverse food reactions, not elsewhere classified, initial encounter: T78.19XA

## 2018-12-10 HISTORY — DX: Other adverse food reactions, not elsewhere classified, initial encounter: T78.1XXA

## 2018-12-10 LAB — CBC
HCT: 38.4 % — ABNORMAL LOW (ref 39.0–52.0)
Hemoglobin: 12.9 g/dL — ABNORMAL LOW (ref 13.0–17.0)
MCH: 32.1 pg (ref 26.0–34.0)
MCHC: 33.6 g/dL (ref 30.0–36.0)
MCV: 95.5 fL (ref 80.0–100.0)
Platelets: 184 10*3/uL (ref 150–400)
RBC: 4.02 MIL/uL — ABNORMAL LOW (ref 4.22–5.81)
RDW: 12.3 % (ref 11.5–15.5)
WBC: 3.6 10*3/uL — ABNORMAL LOW (ref 4.0–10.5)
nRBC: 0 % (ref 0.0–0.2)

## 2018-12-10 LAB — BASIC METABOLIC PANEL
Anion gap: 8 (ref 5–15)
BUN: 14 mg/dL (ref 8–23)
CO2: 24 mmol/L (ref 22–32)
Calcium: 9.4 mg/dL (ref 8.9–10.3)
Chloride: 107 mmol/L (ref 98–111)
Creatinine, Ser: 0.87 mg/dL (ref 0.61–1.24)
GFR calc Af Amer: >60 mL/min (ref >60)
GFR calc non Af Amer: >60 mL/min (ref >60)
Glucose, Bld: 91 mg/dL (ref 70–99)
Potassium: 4 mmol/L (ref 3.5–5.1)
Sodium: 139 mmol/L (ref 135–145)

## 2018-12-10 LAB — TROPONIN I (HIGH SENSITIVITY): Troponin I (High Sensitivity): 6 ng/L (ref <18)

## 2018-12-10 MED ORDER — SODIUM CHLORIDE 0.9% FLUSH: 3.0000 mL | Freq: Once | INTRAVENOUS | Status: DC

## 2018-12-10 NOTE — ED Triage Notes (Signed)
Pt reports chest pain, general fatigue, and indigestion for 2-3 weeks. Has scheduled visit with cardiology coming up. Pt states he has had persistent indigestion today that isn't relieved with medication.

## 2018-12-16 ENCOUNTER — Encounter: Payer: Self-pay | Admitting: Cardiovascular Disease

## 2018-12-16 ENCOUNTER — Ambulatory Visit: Payer: Medicare HMO | Admitting: Cardiovascular Disease

## 2018-12-16 ENCOUNTER — Other Ambulatory Visit: Payer: Self-pay

## 2018-12-16 VITALS — BP 122/71 | HR 59 | Temp 97.5°F | Ht 71.0 in | Wt 230.0 lb

## 2018-12-16 DIAGNOSIS — Z720 Tobacco use: Secondary | ICD-10-CM | POA: Diagnosis not present

## 2018-12-16 DIAGNOSIS — I25118 Atherosclerotic heart disease of native coronary artery with other forms of angina pectoris: Secondary | ICD-10-CM

## 2018-12-16 DIAGNOSIS — E785 Hyperlipidemia, unspecified: Secondary | ICD-10-CM

## 2018-12-16 DIAGNOSIS — Z955 Presence of coronary angioplasty implant and graft: Secondary | ICD-10-CM | POA: Diagnosis not present

## 2018-12-16 DIAGNOSIS — I252 Old myocardial infarction: Secondary | ICD-10-CM

## 2018-12-16 MED ORDER — NITROGLYCERIN 0.4 MG SL SUBL: SUBLINGUAL_TABLET | SUBLINGUAL | 3 refills | Status: DC

## 2018-12-16 NOTE — Patient Instructions (Signed)

## 2018-12-16 NOTE — Addendum Note (Signed)
Addended by: Debbora Lacrosse R on: 12/16/2018 03:28 PM   Modules accepted: Orders

## 2018-12-16 NOTE — Progress Notes (Signed)
SUBJECTIVE: The patient presents for overdue follow-up.  He has a history of non-STEMI with a stent to the RCA.  He underwent a low risk nuclear stress test on 06/17/2016.  It demonstrated a prior small inferior MI with no evidence of ischemia.  Echocardiogram demonstrated normal left ventricular systolic function, LVEF 0000000, mild LVH, probable basal inferior hypokinesis, and grossly normal diastolic function.  He has a long history of tobacco use.  He was evaluated in the ED for some chest discomfort and belching on 12/10/2018.  He did not stay long enough to be evaluated by a physician.  I reviewed all relevant labs and studies.  Chest x-ray was normal.  High-sensitivity troponin was normal.  Hemoglobin was 12.9.  I personally reviewed the ECG which demonstrated sinus bradycardia, 50 bpm, with right bundle branch block and left axis deviation.  The following day he had some soft stools.  He had been having some chest discomfort accompanied by lots of belching which prompted him to go to the ED.  His energy levels were somewhat low.  Since then he has been doing very well and denies chest pain, palpitations, leg swelling, and shortness of breath.     Review of Systems: As per "subjective", otherwise negative.  Allergies  Allergen Reactions  . Atorvastatin Other (See Comments)    myalgias  . Sulfa Antibiotics   . Penicillins Rash    Childhood allergy. Has patient had a PCN reaction causing immediate rash, facial/tongue/throat swelling, SOB or lightheadedness with hypotension: unknown Has patient had a PCN reaction causing severe rash involving mucus membranes or skin necrosis: unknown Has patient had a PCN reaction that required hospitalization: no Has patient had a PCN reaction occurring within the last 10 years: no If all of the above answers are "NO", then may proceed with Cephalosporin use.     Current Outpatient Medications  Medication Sig Dispense Refill  .  Ascorbic Acid (VITAMIN C) 500 MG CAPS Take 2,000 mg by mouth daily.    Marland Kitchen aspirin 81 MG tablet Take 81 mg by mouth daily.    . diphenhydrAMINE (BENADRYL) 25 MG tablet Take 25 mg by mouth once as needed for allergies.    Marland Kitchen EPINEPHrine 0.3 mg/0.3 mL IJ SOAJ injection When you have an allergic reaction go ahead and give yourself 1 shot and then come to the emergency department 2 Device 0  . fenofibrate 160 MG tablet Take 160 mg by mouth daily.    . Multiple Vitamin (MULTIVITAMIN WITH MINERALS) TABS tablet Take 1 tablet by mouth daily.    . nitroGLYCERIN (NITROSTAT) 0.4 MG SL tablet PLACE 1 TABLET UNDER TONGUE EVERY 5 MINUTES FOR 3 DOSES AS NEEDED FOR CHEST PAIN 25 tablet 0  . pravastatin (PRAVACHOL) 80 MG tablet Take 80 mg by mouth daily.    . sildenafil (VIAGRA) 100 MG tablet Take 1/2 tablet (50 mg) 30 minutes prior to sexual activity 10 tablet 1   No current facility-administered medications for this visit.     Past Medical History:  Diagnosis Date  . Allergic reaction to alpha-gal   . 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 Bradycardia   . Cancer ([place])    bladder  . Heart murmur   . Hyperlipidemia   . Myocardial infarct ([place])   . Tobacco abuse  60 pack years; 1.5 pack per day    Past Surgical History:  Procedure Laterality Date  . APPENDECTOMY    . CORONARY STENT PLACEMENT    . LEFT HEART CATHETERIZATION WITH CORONARY ANGIOGRAM N/A 09/11/2011   Procedure: LEFT HEART CATHETERIZATION WITH CORONARY ANGIOGRAM;  Surgeon: Sherren Mocha, MD;  Location: [facility] CATH LAB;  Service: Cardiovascular;  Laterality: N/A;  . PERCUTANEOUS CORONARY STENT INTERVENTION (PCI-S) Right 09/11/2011   Procedure: PERCUTANEOUS CORONARY STENT INTERVENTION (PCI-S);  Surgeon: Sherren Mocha, MD;  Location: [facility] CATH LAB;  Service: Cardiovascular;   Laterality: Right;    Social History   Socioeconomic History  . Marital status: Married    Spouse name: Not on file  . Number of children: Not on file  . Years of education: Not on file  . Highest education level: Not on file  Occupational History  . 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 Financial resource strain: Not on file  . Food insecurity    Worry: Not on file    Inability: Not on file  . Transportation needs    Medical: Not on file    Non-medical: Not on file  Tobacco Use  . Smoking status: Current Every Day Smoker    Packs/day: 1.00    Years: 40.00    Pack years: 40.00    Types: Cigarettes    Start date: 03/02/1965    Last attempt to quit: 06/17/2016    Years since quitting: 2.4  . Smokeless tobacco: Never Used  Substance and Sexual Activity  . Alcohol use: No    Alcohol/week: 0.0 standard drinks  . Drug use: No  . Sexual activity: Not on file  Lifestyle  . Physical activity    Days per week: Not on file    Minutes per session: Not on file  . Stress: Not on file  Relationships  . Social Herbalist on phone: Not on file    Gets together: Not on file    Attends religious service: Not on file    Active member of club or organization: Not on file    Attends meetings of clubs or organizations: Not on file    Relationship status: Not on file  . Intimate partner violence    Fear of current or ex partner: Not on file    Emotionally abused: Not on file    Physically abused: Not on file    Forced sexual activity: Not on file  Other Topics Concern  . Not on file  Social History Narrative  . Not on file     Vitals:   12/16/18 1501  BP: 122/71  Pulse: (!) 59  Temp: (!) 97.5 F (36.4 C)  TempSrc: Temporal  SpO2: 96%  Height: 5\' 11"  (1.803 m)    Wt Readings from Last 3 Encounters:  02/22/18 211 lb (95.7 kg)  01/19/17 230 lb (104.3 kg)  07/01/16 228 lb (103.4 kg)     PHYSICAL  EXAM General: NAD HEENT: Normal. Neck: No JVD, no thyromegaly. Lungs: Clear to auscultation bilaterally with normal respiratory effort. CV: Regular rate and rhythm, normal S1/S2, no S3/S4, no murmur. No pretibial or periankle edema.  No carotid bruit.   Abdomen: Soft, nontender, no distention.  Neurologic: Alert and oriented.  Psych: Normal affect. Skin: Normal. Musculoskeletal: No gross deformities.      Labs: Lab Results  Component Value Date/Time   K 4.0 12/10/2018 03:42 PM  BUN 14 12/10/2018 03:42 PM   CREATININE 0.87 12/10/2018 03:42 PM   CREATININE 0.85 09/22/2011 01:38 PM   ALT 15 03/22/2012 11:19 AM   TSH 2.394 06/17/2016 05:44 AM   TSH 1.349 09/11/2011 09:58 PM   HGB 12.9 (L) 12/10/2018 03:42 PM     Lipids: Lab Results  Component Value Date/Time   LDLCALC 79 12/29/2011 08:40 AM   CHOL 161 12/29/2011 08:40 AM   TRIG 269 (H) 12/29/2011 08:40 AM   HDL 28 (L) 12/29/2011 08:40 AM       ASSESSMENT AND PLAN: 1.  Coronary artery disease with RCA stent: Symptomatically stable with low risk nuclear stress test in April 2018.  No changes to therapy.  Continue aspirin and statin.    Beta-blockers led to bradycardia in the past.  I will refill sublingual nitroglycerin.  2.  Hypercholesterolemia: Continue statin therapy.  3.  Tobacco abuse: He is not interested in quitting.      Disposition: Follow up 6 months   Kate Sable, M.D., F.A.C.C.

## 2019-01-04 DIAGNOSIS — C674 Malignant neoplasm of posterior wall of bladder: Secondary | ICD-10-CM | POA: Diagnosis not present

## 2019-01-04 DIAGNOSIS — C679 Malignant neoplasm of bladder, unspecified: Secondary | ICD-10-CM | POA: Diagnosis not present

## 2019-01-10 DIAGNOSIS — E119 Type 2 diabetes mellitus without complications: Secondary | ICD-10-CM | POA: Diagnosis not present

## 2019-01-10 DIAGNOSIS — C674 Malignant neoplasm of posterior wall of bladder: Secondary | ICD-10-CM | POA: Diagnosis not present

## 2019-02-06 DIAGNOSIS — E8881 Metabolic syndrome: Secondary | ICD-10-CM | POA: Diagnosis not present

## 2019-02-06 DIAGNOSIS — E119 Type 2 diabetes mellitus without complications: Secondary | ICD-10-CM | POA: Diagnosis not present

## 2019-02-06 DIAGNOSIS — C679 Malignant neoplasm of bladder, unspecified: Secondary | ICD-10-CM | POA: Diagnosis not present

## 2019-02-06 DIAGNOSIS — E785 Hyperlipidemia, unspecified: Secondary | ICD-10-CM | POA: Diagnosis not present

## 2019-02-06 DIAGNOSIS — I2510 Atherosclerotic heart disease of native coronary artery without angina pectoris: Secondary | ICD-10-CM | POA: Diagnosis not present

## 2019-02-06 DIAGNOSIS — F17200 Nicotine dependence, unspecified, uncomplicated: Secondary | ICD-10-CM | POA: Diagnosis not present

## 2019-02-06 DIAGNOSIS — E669 Obesity, unspecified: Secondary | ICD-10-CM | POA: Diagnosis not present

## 2019-04-12 DIAGNOSIS — N5201 Erectile dysfunction due to arterial insufficiency: Secondary | ICD-10-CM | POA: Diagnosis not present

## 2019-04-12 DIAGNOSIS — E119 Type 2 diabetes mellitus without complications: Secondary | ICD-10-CM | POA: Diagnosis not present

## 2019-04-12 DIAGNOSIS — C674 Malignant neoplasm of posterior wall of bladder: Secondary | ICD-10-CM | POA: Diagnosis not present

## 2019-06-08 DIAGNOSIS — E669 Obesity, unspecified: Secondary | ICD-10-CM | POA: Diagnosis not present

## 2019-06-08 DIAGNOSIS — F17200 Nicotine dependence, unspecified, uncomplicated: Secondary | ICD-10-CM | POA: Diagnosis not present

## 2019-06-08 DIAGNOSIS — C679 Malignant neoplasm of bladder, unspecified: Secondary | ICD-10-CM | POA: Diagnosis not present

## 2019-06-08 DIAGNOSIS — E119 Type 2 diabetes mellitus without complications: Secondary | ICD-10-CM | POA: Diagnosis not present

## 2019-06-08 DIAGNOSIS — E785 Hyperlipidemia, unspecified: Secondary | ICD-10-CM | POA: Diagnosis not present

## 2019-06-08 DIAGNOSIS — I2510 Atherosclerotic heart disease of native coronary artery without angina pectoris: Secondary | ICD-10-CM | POA: Diagnosis not present

## 2019-06-08 DIAGNOSIS — E8881 Metabolic syndrome: Secondary | ICD-10-CM | POA: Diagnosis not present

## 2019-06-16 ENCOUNTER — Ambulatory Visit: Payer: Medicare HMO | Admitting: Cardiovascular Disease

## 2019-06-16 ENCOUNTER — Other Ambulatory Visit: Payer: Self-pay

## 2019-06-16 ENCOUNTER — Encounter: Payer: Self-pay | Admitting: Cardiovascular Disease

## 2019-06-16 VITALS — BP 132/84 | HR 52 | Temp 97.5°F | Ht 71.0 in | Wt 234.0 lb

## 2019-06-16 DIAGNOSIS — Z955 Presence of coronary angioplasty implant and graft: Secondary | ICD-10-CM

## 2019-06-16 DIAGNOSIS — I25118 Atherosclerotic heart disease of native coronary artery with other forms of angina pectoris: Secondary | ICD-10-CM

## 2019-06-16 DIAGNOSIS — E785 Hyperlipidemia, unspecified: Secondary | ICD-10-CM

## 2019-06-16 DIAGNOSIS — Z720 Tobacco use: Secondary | ICD-10-CM

## 2019-06-16 DIAGNOSIS — R001 Bradycardia, unspecified: Secondary | ICD-10-CM | POA: Diagnosis not present

## 2019-06-16 NOTE — Progress Notes (Signed)
SUBJECTIVE: The patient presents for follow-up of coronary artery disease.  He has a history of non-STEMI with a stent to the RCA.  He underwent a low risk nuclear stress test on 06/17/2016.  It demonstrated a prior small inferior MI with no evidence of ischemia.  Echocardiogram demonstrated normal left ventricular systolic function, LVEF 0000000, mild LVH, probable basal inferior hypokinesis, and grossly normal diastolic function.  He has a long history of tobacco use.  He has been eating more sweets and getting less exercise and just recently has been doing more yard work lately.  He has to stop and take breaks due to shortness of breath.  He may break out in a sweat.  He denies chest pain.  He said his lipids were recently checked by his PCP and triglycerides 1 on the 300-400 range.  He was also told he is prediabetic.     Review of Systems: As per "subjective", otherwise negative.  Allergies  Allergen Reactions  . Atorvastatin Other (See Comments)    myalgias  . Sulfa Antibiotics   . Penicillins Rash    Childhood allergy. Has patient had a PCN reaction causing immediate rash, facial/tongue/throat swelling, SOB or lightheadedness with hypotension: unknown Has patient had a PCN reaction causing severe rash involving mucus membranes or skin necrosis: unknown Has patient had a PCN reaction that required hospitalization: no Has patient had a PCN reaction occurring within the last 10 years: no If all of the above answers are "NO", then may proceed with Cephalosporin use.     Current Outpatient Medications  Medication Sig Dispense Refill  . Ascorbic Acid (VITAMIN C) 500 MG CAPS Take 2,000 mg by mouth daily.    Marland Kitchen aspirin 81 MG tablet Take 81 mg by mouth daily.    . diphenhydrAMINE (BENADRYL) 25 MG tablet Take 25 mg by mouth once as needed for allergies.    Marland Kitchen EPINEPHrine 0.3 mg/0.3 mL IJ SOAJ injection When you have an allergic reaction go ahead and give yourself 1 shot  and then come to the emergency department 2 Device 0  . fenofibrate 160 MG tablet Take 160 mg by mouth daily.    . Multiple Vitamin (MULTIVITAMIN WITH MINERALS) TABS tablet Take 1 tablet by mouth daily.    . nitroGLYCERIN (NITROSTAT) 0.4 MG SL tablet PLACE 1 TABLET UNDER TONGUE EVERY 5 MINUTES FOR 3 DOSES AS NEEDED FOR CHEST PAIN 25 tablet 3  . pravastatin (PRAVACHOL) 80 MG tablet Take 80 mg by mouth daily.    . sildenafil (VIAGRA) 100 MG tablet Take 1/2 tablet (50 mg) 30 minutes prior to sexual activity 10 tablet 1   No current facility-administered medications for this visit.    Past Medical History:  Diagnosis Date  . Allergic reaction to alpha-gal   . 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 Bradycardia   . Cancer ([place])    bladder  . Heart murmur   . Hyperlipidemia   . Myocardial infarct ([place])   . Tobacco abuse    60 pack years; 1.5 pack per day    Past Surgical History:  Procedure Laterality Date  . APPENDECTOMY    . CORONARY STENT PLACEMENT    . LEFT HEART CATHETERIZATION WITH CORONARY ANGIOGRAM N/A 09/11/2011   Procedure: LEFT HEART CATHETERIZATION WITH CORONARY ANGIOGRAM;  Surgeon: Sherren Mocha, MD;  Location: [facility] CATH LAB;  Service: Cardiovascular;  Laterality: N/A;  . PERCUTANEOUS CORONARY STENT INTERVENTION (PCI-S) Right 09/11/2011   Procedure: PERCUTANEOUS CORONARY STENT INTERVENTION (PCI-S);  Surgeon: Sherren Mocha, MD;  Location: [facility] CATH LAB;  Service: Cardiovascular;  Laterality: Right;    Social History   Socioeconomic History  . Marital status: Married    Spouse name: Not on file  . Number of children: Not on file  . Years of education: Not on file  . Highest education level: Not on file  Occupational History  . 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 Smoking status: Current Every Day Smoker    Packs/day: 1.00    Years: 40.00    Pack years: 40.00    Types: Cigarettes    Start date: 03/02/1965    Last attempt to quit: 06/17/2016    Years since quitting: 2.9  . Smokeless tobacco: Never Used  Substance and Sexual Activity  . Alcohol use: No    Alcohol/week: 0.0 standard drinks  . Drug use: No  . Sexual activity: Not on file  Other Topics Concern  . Not on file  Social History Narrative  . Not on file   Social Determinants of Health   Financial Resource Strain:   . Difficulty of Paying Living Expenses:   Food Insecurity:   . Worried About Charity fundraiser in the Last Year:   . Arboriculturist in the Last Year:   Transportation Needs:   . Film/video editor (Medical):   Marland Kitchen Lack of Transportation (Non-Medical):   Physical Activity:   . Days of Exercise per Week:   . Minutes of Exercise per Session:   Stress:   . Feeling of Stress :   Social Connections:   . Frequency of Communication with Friends and Family:   . Frequency of Social Gatherings with Friends and Family:   . Attends Religious Services:   . Active Member of Clubs or Organizations:   . Attends Archivist Meetings:   Marland Kitchen Marital Status:   Intimate Partner Violence:   . Fear of Current or Ex-Partner:   . Emotionally Abused:   Marland Kitchen Physically Abused:   . Sexually Abused:       Vitals:   06/16/19 1008  BP: 132/84  Pulse: (!) 52  Temp: (!) 97.5 F (36.4 C)  SpO2: 97%  Weight: 234 lb (106.1 kg)  Height: 5\' 11"  (1.803 m)    Wt Readings from Last 3 Encounters:  06/16/19 234 lb (106.1 kg)  12/16/18 230 lb (104.3 kg)  02/22/18 211 lb (95.7 kg)     PHYSICAL EXAM General: NAD HEENT: Normal. Neck: No JVD, no thyromegaly. Lungs: Clear to auscultation bilaterally with normal respiratory effort. CV: Bradycardic, regular rhythm, normal S1/S2, no S3/S4, no murmur. No pretibial or periankle edema.  No carotid bruit.   Abdomen:  Soft, nontender, no distention.  Neurologic: Alert and oriented.  Psych: Normal affect. Skin: Normal. Musculoskeletal: No gross deformities.      Labs: Lab Results  Component Value Date/Time   K 4.0 12/10/2018 03:42 PM   BUN 14 12/10/2018 03:42 PM   CREATININE 0.87 12/10/2018 03:42 PM   CREATININE 0.85 09/22/2011 01:38 PM   ALT 15 03/22/2012 11:19 AM   TSH 2.394 06/17/2016 05:44 AM   TSH 1.349 09/11/2011 09:58 PM   HGB 12.9 (L) 12/10/2018 03:42 PM     Lipids: Lab Results  Component Value Date/Time   LDLCALC 79 12/29/2011 08:40 AM   CHOL 161 12/29/2011 08:40 AM   TRIG 269 (H) 12/29/2011 08:40 AM   HDL 28 (L) 12/29/2011 08:40 AM       ASSESSMENT AND PLAN:  1. Coronary artery disease with RCA stent: Symptomatically stable with low risk nuclear stress test in April 2018. No changes to therapy. Continue aspirin and statin. Beta-blockers led to bradycardia in the past.  He is bradycardic today without AV nodal blocking agents.  2.  Hypertension: Blood pressure is normal.  No changes to therapy.  3.  Hypercholesterolemia: Continue pravastatin.  I will obtain a copy of lipids from PCP.  Goal LDL less than 70.  He told me triglycerides are in the 300-400 range.  If that is the case he would benefit from Spur.  4.  Tobacco abuse: He is not interested in quitting.  5.  Bradycardia: He is asymptomatic.  He is not on AV nodal blocking agents.  I will monitor.   Disposition: Follow up 1 yr   Kate Sable, M.D., F.A.C.C.

## 2019-06-16 NOTE — Patient Instructions (Signed)
Medication Instructions:  Your physician recommends that you continue on your current medications as directed. Please refer to the Current Medication list given to you today.  *If you need a refill on your cardiac medications before your next appointment, please call your pharmacy*   Lab Work: None today If you have labs (blood work) drawn today and your tests are completely normal, you will receive your results only by: . MyChart Message (if you have MyChart) OR . A paper copy in the mail If you have any lab test that is abnormal or we need to change your treatment, we will call you to review the results.   Testing/Procedures: None today   Follow-Up: At [facility], you and your health needs are our priority.  As part of our continuing mission to provide you with exceptional heart care, we have created designated Provider Care Teams.  These Care Teams include your primary Cardiologist (physician) and Advanced Practice Providers (APPs -  Physician Assistants and Nurse Practitioners) who all work together to provide you with the care you need, when you need it.  We recommend signing up for the patient portal called "MyChart".  Sign up information is provided on this After Visit Summary.  MyChart is used to connect with patients for Virtual Visits (Telemedicine).  Patients are able to view lab/test results, encounter notes, upcoming appointments, etc.  Non-urgent messages can be sent to your provider as well.   To learn more about what you can do with MyChart, go to [email or web address].    Your next appointment:   12 month(s)  The format for your next appointment:   In Person  Provider:   Suresh Koneswaran, MD   Other Instructions None      Thank you for choosing [facility] !         

## 2019-07-14 DIAGNOSIS — C674 Malignant neoplasm of posterior wall of bladder: Secondary | ICD-10-CM | POA: Diagnosis not present

## 2020-01-04 DIAGNOSIS — N281 Cyst of kidney, acquired: Secondary | ICD-10-CM | POA: Diagnosis not present

## 2020-01-04 DIAGNOSIS — C674 Malignant neoplasm of posterior wall of bladder: Secondary | ICD-10-CM | POA: Diagnosis not present

## 2020-01-04 DIAGNOSIS — I700 Atherosclerosis of aorta: Secondary | ICD-10-CM | POA: Diagnosis not present

## 2020-01-04 DIAGNOSIS — C679 Malignant neoplasm of bladder, unspecified: Secondary | ICD-10-CM | POA: Diagnosis not present

## 2020-01-08 DIAGNOSIS — H5200 Hypermetropia, unspecified eye: Secondary | ICD-10-CM | POA: Diagnosis not present

## 2020-01-10 DIAGNOSIS — C674 Malignant neoplasm of posterior wall of bladder: Secondary | ICD-10-CM | POA: Diagnosis not present

## 2020-04-01 DIAGNOSIS — E119 Type 2 diabetes mellitus without complications: Secondary | ICD-10-CM | POA: Diagnosis not present

## 2020-04-01 DIAGNOSIS — E785 Hyperlipidemia, unspecified: Secondary | ICD-10-CM | POA: Diagnosis not present

## 2020-04-04 DIAGNOSIS — C679 Malignant neoplasm of bladder, unspecified: Secondary | ICD-10-CM | POA: Diagnosis not present

## 2020-04-04 DIAGNOSIS — E669 Obesity, unspecified: Secondary | ICD-10-CM | POA: Diagnosis not present

## 2020-04-04 DIAGNOSIS — F17200 Nicotine dependence, unspecified, uncomplicated: Secondary | ICD-10-CM | POA: Diagnosis not present

## 2020-04-04 DIAGNOSIS — E785 Hyperlipidemia, unspecified: Secondary | ICD-10-CM | POA: Diagnosis not present

## 2020-04-04 DIAGNOSIS — Z7984 Long term (current) use of oral hypoglycemic drugs: Secondary | ICD-10-CM | POA: Diagnosis not present

## 2020-04-04 DIAGNOSIS — E119 Type 2 diabetes mellitus without complications: Secondary | ICD-10-CM | POA: Diagnosis not present

## 2020-04-04 DIAGNOSIS — E8881 Metabolic syndrome: Secondary | ICD-10-CM | POA: Diagnosis not present

## 2020-04-04 DIAGNOSIS — I2510 Atherosclerotic heart disease of native coronary artery without angina pectoris: Secondary | ICD-10-CM | POA: Diagnosis not present

## 2020-04-18 IMAGING — DX DG CHEST 2V
4 series · 4 of 4 positions shown · non-contrast
Comparison: Single-view of the chest 02/22/2018.

CLINICAL DATA: Chest pain, fatigue and ingestion for 2-3 weeks.

EXAM:
CHEST - 2 VIEW

[chest pa (1 of 2)]
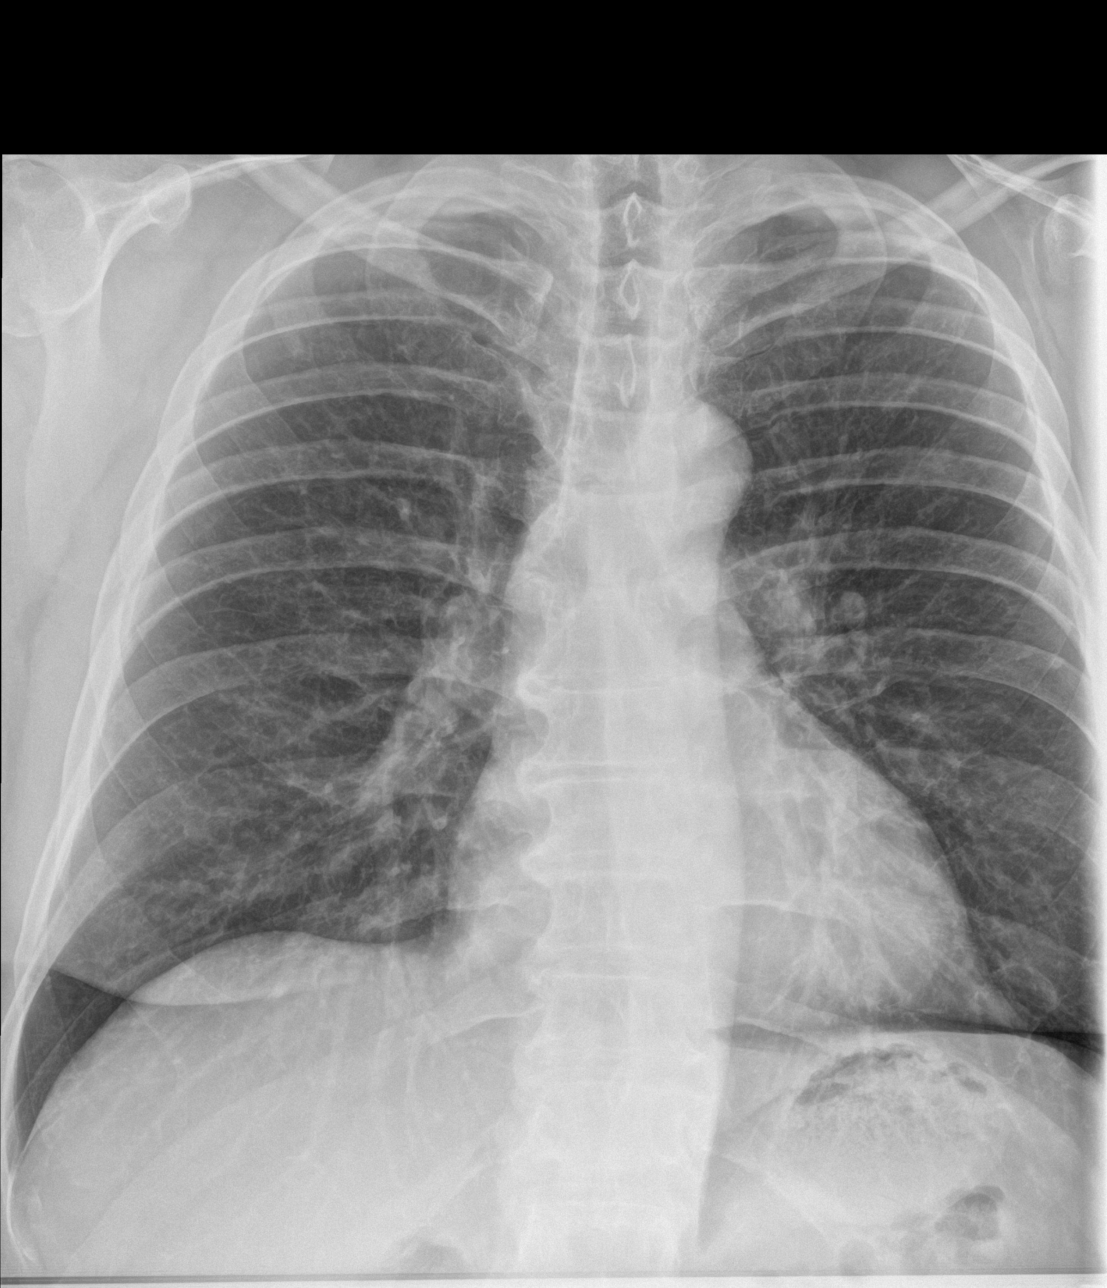

[chest lat (1 of 2)]
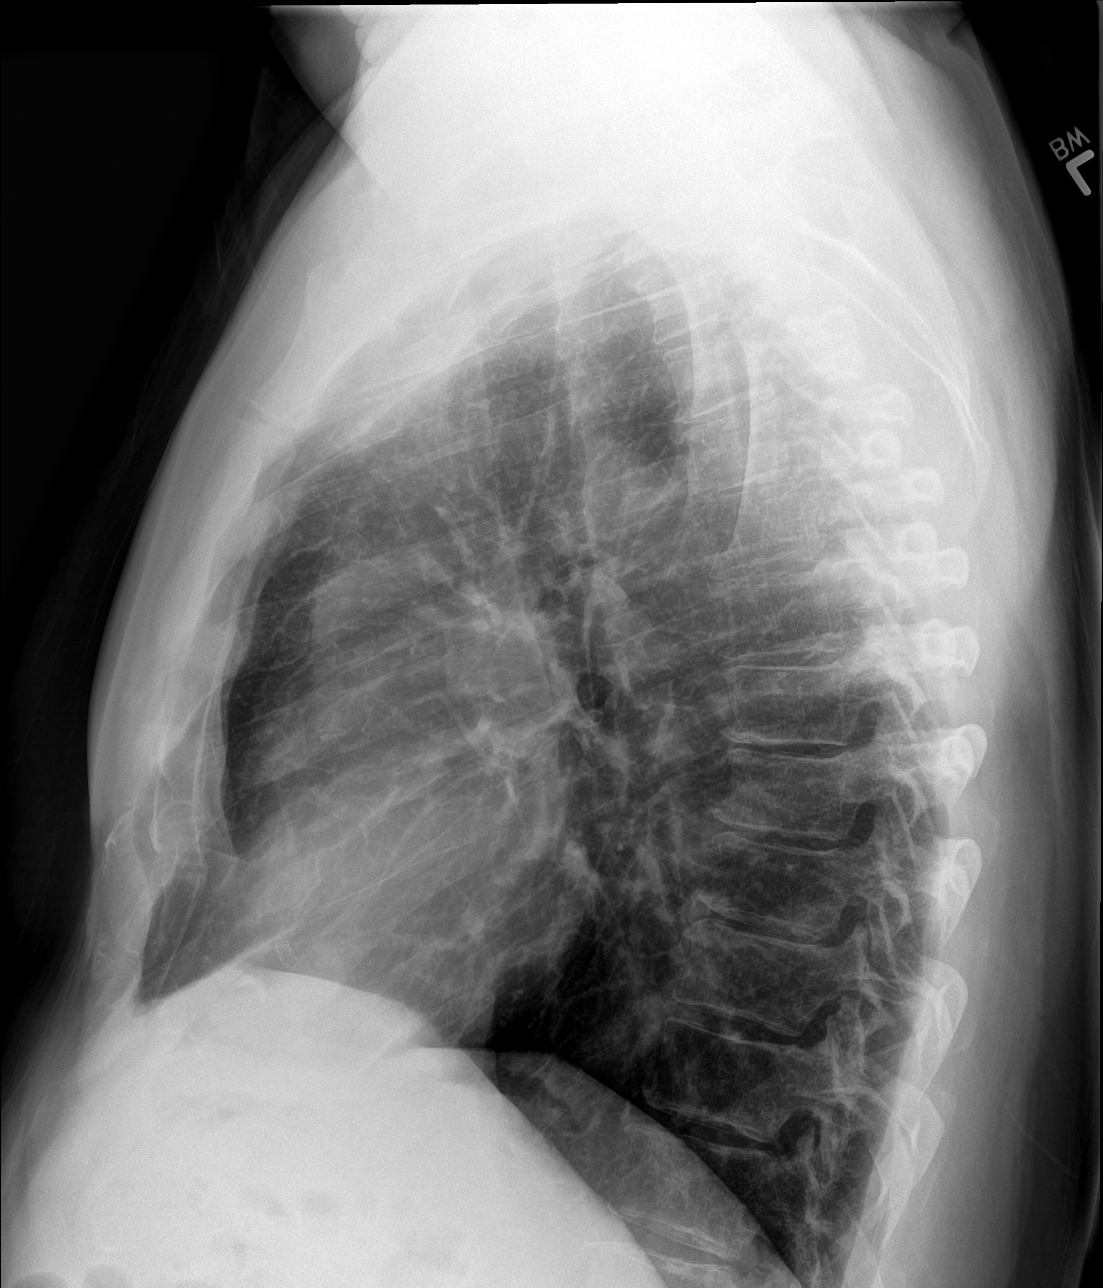

[chest pa (2 of 2)]
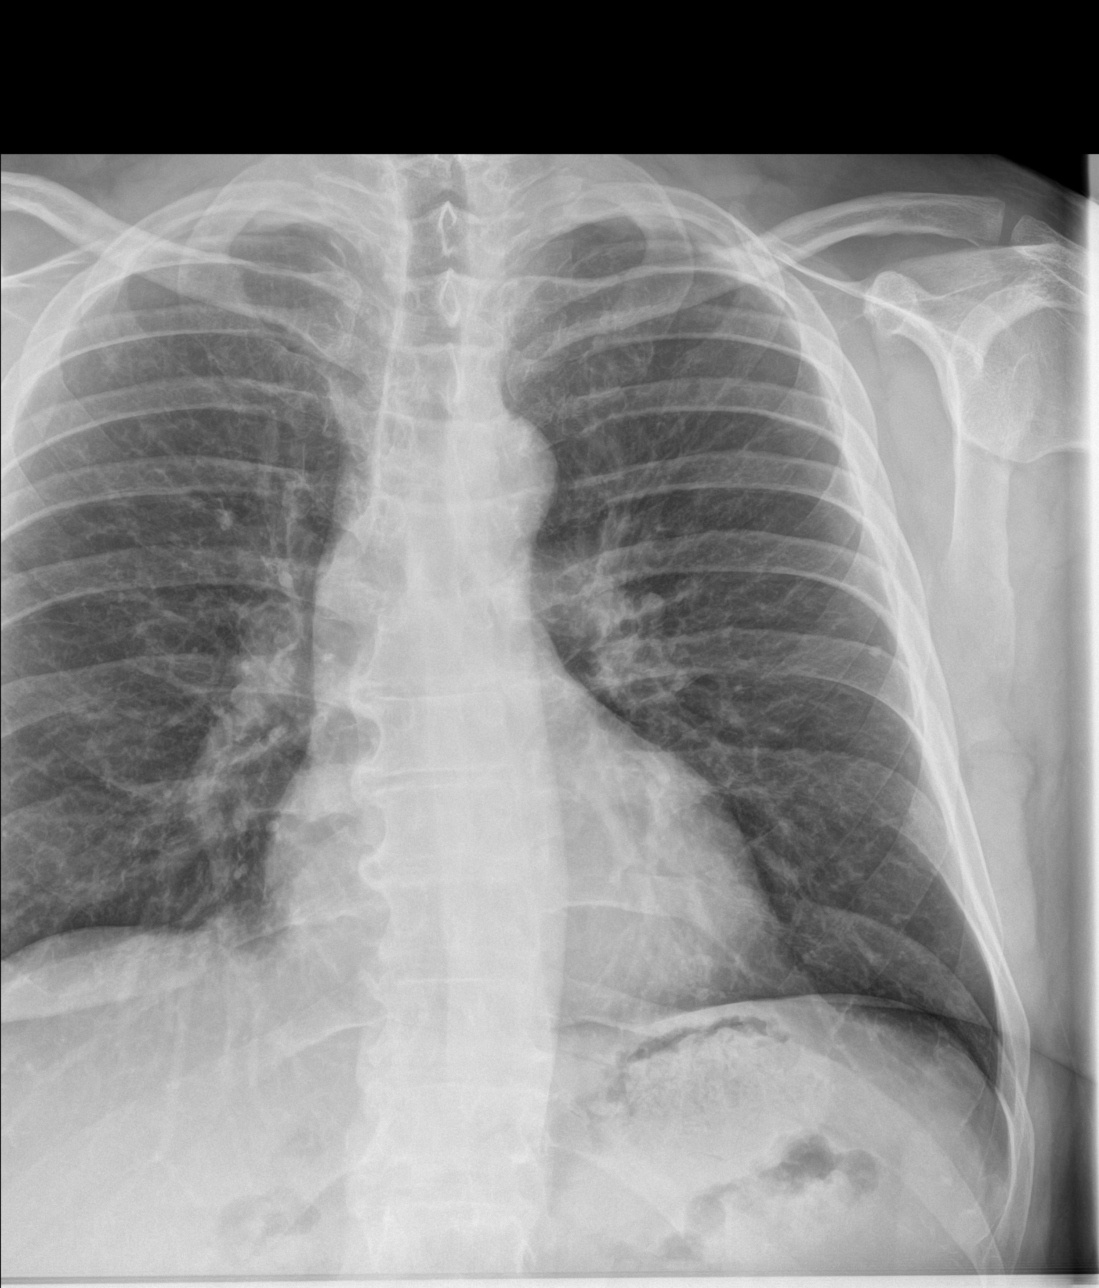

[chest lat (2 of 2)]
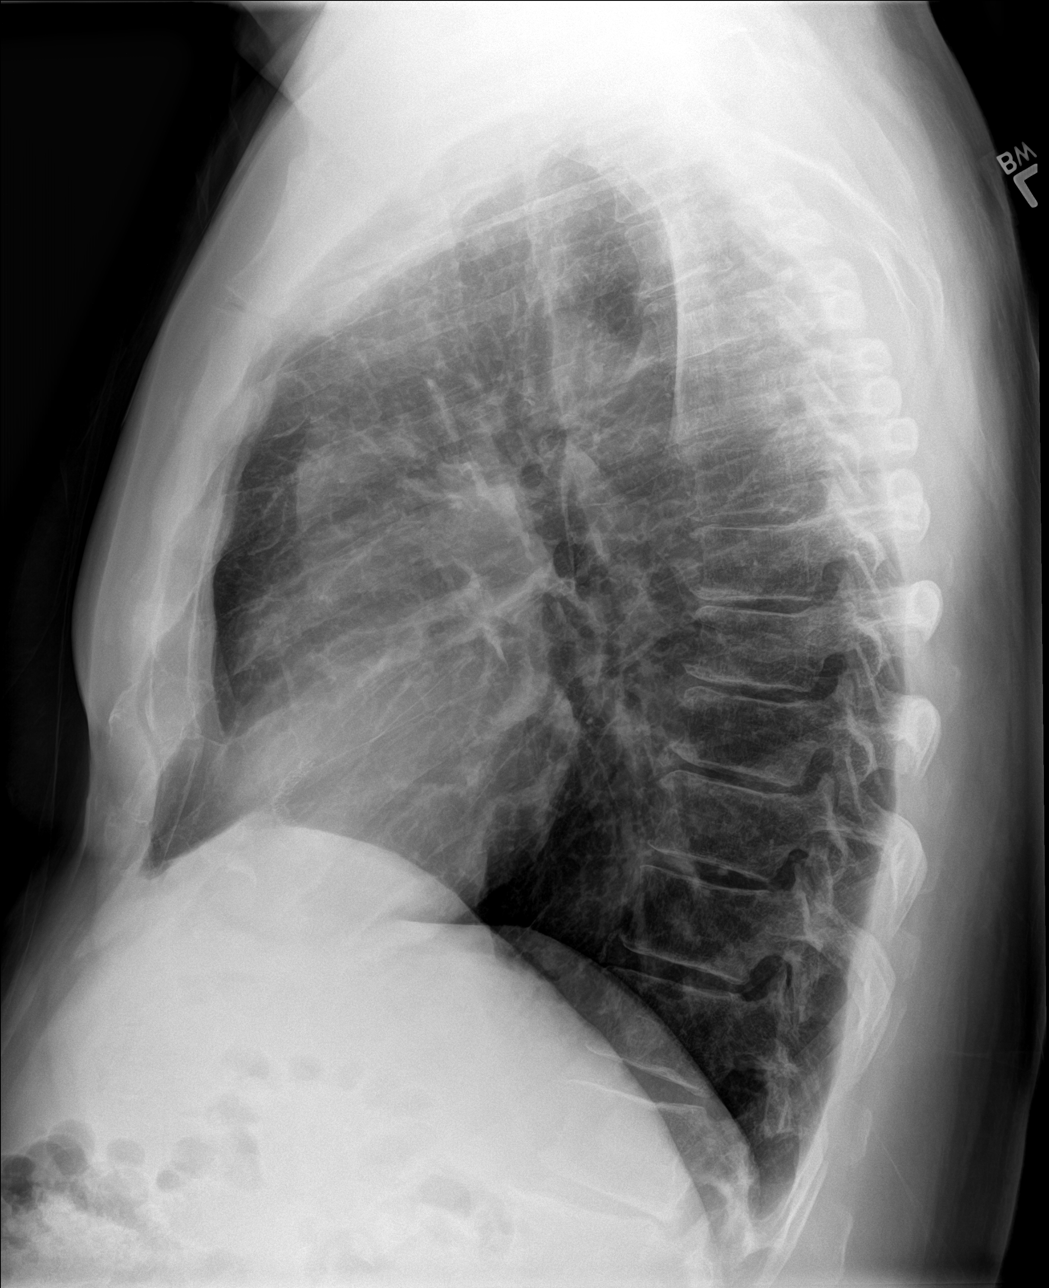

[4 of 4 positions shown; findings below may reference images not displayed]

FINDINGS: Lungs clear. Heart size normal. No pneumothorax or pleural fluid. No
acute or focal bony abnormality.
IMPRESSION: Negative chest.

## 2020-06-18 ENCOUNTER — Ambulatory Visit: Payer: Medicare HMO | Admitting: Student

## 2020-06-18 ENCOUNTER — Encounter: Payer: Self-pay | Admitting: Student

## 2020-06-18 ENCOUNTER — Other Ambulatory Visit: Payer: Self-pay

## 2020-06-18 VITALS — BP 132/84 | HR 56 | Ht 71.0 in | Wt 229.0 lb

## 2020-06-18 DIAGNOSIS — E785 Hyperlipidemia, unspecified: Secondary | ICD-10-CM | POA: Diagnosis not present

## 2020-06-18 DIAGNOSIS — Z720 Tobacco use: Secondary | ICD-10-CM

## 2020-06-18 DIAGNOSIS — R001 Bradycardia, unspecified: Secondary | ICD-10-CM | POA: Diagnosis not present

## 2020-06-18 DIAGNOSIS — I2510 Atherosclerotic heart disease of native coronary artery without angina pectoris: Secondary | ICD-10-CM | POA: Diagnosis not present

## 2020-06-18 DIAGNOSIS — J449 Chronic obstructive pulmonary disease, unspecified: Secondary | ICD-10-CM | POA: Diagnosis not present

## 2020-06-18 NOTE — Patient Instructions (Signed)
Medication Instructions:   Your physician recommends that you continue on your current medications as directed. Please refer to the Current Medication list given to you today.  *If you need a refill on your cardiac medications before your next appointment, please call your pharmacy*  Lab Work: NONE ordered at this time of appointment   If you have labs (blood work) drawn today and your tests are completely normal, you will receive your results only by: Marland Kitchen MyChart Message (if you have MyChart) OR . A paper copy in the mail If you have any lab test that is abnormal or we need to change your treatment, we will call you to review the results.  Testing/Procedures: NONE ordered at this time of appointment    Follow-Up: At [facility], you and your health needs are our priority.  As part of our continuing mission to provide you with exceptional heart care, we have created designated Provider Care Teams.  These Care Teams include your primary Cardiologist (physician) and Advanced Practice Providers (APPs -  Physician Assistants and Nurse Practitioners) who all work together to provide you with the care you need, when you need it.    Your next appointment:   1 year(s)  The format for your next appointment:   In Person  Provider:   Bernerd Pho, Hershal Coria New MD   Other Instructions

## 2020-06-18 NOTE — Progress Notes (Signed)
Cardiology Office Note    Date:  06/18/2020   ID:  Sean Gomez, DOB 1950-08-22, MRN [id number]  PCP:  [name], MD  Cardiologist: Previously Dr. Bronson Ing --> Needs to switch to new MD  Chief Complaint  Patient presents with  . Follow-up    Annual Visit    History of Present Illness:    Sean Gomez is a 70 y.o. male with past medical history of CAD (s/p NSTEMI with DES to [facility] in 08/2011, low-risk NST in 05/2016), HLD, Type 2 DM and tobacco use who presents to the office today for annual follow-up.  He was last examined by Dr. Bronson Ing in 06/2019 and denied any recent chest pain but did report intermittent dyspnea when working in the yard and he felt like this was secondary to deconditioning as he been eating more sweets and exercising less. He was continued on ASA and statin therapy but was not on a beta-blocker given baseline bradycardia.  In talking with the patient today, he reports overall doing well from a cardiac perspective since his last office visit. He does experience intermittent dyspnea in the setting of continued tobacco use but says this has overall been stable. He denies any associated chest pain or palpitations. Was previously having episodes of indigestion several years ago but reports this stopped once Lopressor was discontinued. No recent orthopnea, PND or lower extremity edema. Baseline HR is in the 50's to 60's but he denies any associated lightheadedness, dizziness or presyncope.   Past Medical History:  Diagnosis Date  . Allergic reaction to alpha-gal   . 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 Bradycardia   . Cancer ([place])    bladder  . Heart murmur   . Hyperlipidemia   . Myocardial infarct ([place])   . Tobacco abuse    60 pack years; 1.5 pack per day    Past  Surgical History:  Procedure Laterality Date  . APPENDECTOMY    . CORONARY STENT PLACEMENT    . LEFT HEART CATHETERIZATION WITH CORONARY ANGIOGRAM N/A 09/11/2011   Procedure: LEFT HEART CATHETERIZATION WITH CORONARY ANGIOGRAM;  Surgeon: Sherren Mocha, MD;  [facility] CATH LAB;  Service: Cardiovascular;  Laterality: N/A;  . PERCUTANEOUS CORONARY STENT INTERVENTION (PCI-S) Right 09/11/2011   Procedure: PERCUTANEOUS CORONARY STENT INTERVENTION (PCI-S);  Surgeon: Sherren Mocha, MD;  Location: [facility] CATH LAB;  Service: Cardiovascular;  Laterality: Right;    Current Medications: Outpatient Medications Prior to Visit  Medication Sig Dispense Refill  . Ascorbic Acid (VITAMIN C) 500 MG CAPS Take 2,000 mg by mouth daily.    Marland Kitchen aspirin 81 MG tablet Take 81 mg by mouth daily.    . diphenhydrAMINE (BENADRYL) 25 MG tablet Take 25 mg by mouth once as needed for allergies.    Marland Kitchen EPINEPHrine 0.3 mg/0.3 mL IJ SOAJ injection When you have an allergic reaction go ahead and give yourself 1 shot and then come to the emergency department 2 Device 0  . fenofibrate 160 MG tablet Take 160 mg by mouth daily.    . metFORMIN (GLUCOPHAGE) 500 MG tablet     . Multiple Vitamin (MULTIVITAMIN WITH MINERALS) TABS tablet Take 1 tablet by mouth daily.    . nitroGLYCERIN (NITROSTAT) 0.4 MG SL tablet PLACE 1 TABLET UNDER TONGUE EVERY 5 MINUTES FOR 3 DOSES AS NEEDED FOR CHEST  PAIN 25 tablet 3  . rosuvastatin (CRESTOR) 40 MG tablet     . sildenafil (VIAGRA) 100 MG tablet Take 1/2 tablet (50 mg) 30 minutes prior to sexual activity 10 tablet 1  . pravastatin (PRAVACHOL) 80 MG tablet Take 80 mg by mouth daily. (Patient not taking: Reported on 06/18/2020)     No facility-administered medications prior to visit.     Allergies:   Other, Atorvastatin, Sulfa antibiotics, and Penicillins   Social History   Socioeconomic History  . Marital status: Married    Spouse name: Not on file  . Number of children: Not on file  . Years of  education: Not on file  . Highest education level: Not on file  Occupational History  . 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 Smoking status: Current Every Day Smoker    Packs/day: 1.00    Years: 40.00    Pack years: 40.00    Types: Cigarettes    Start date: 03/02/1965    Last attempt to quit: 06/17/2016    Years since quitting: 4.0  . Smokeless tobacco: Never Used  Vaping Use  . Vaping Use: Never used  Substance and Sexual Activity  . Alcohol use: No    Alcohol/week: 0.0 standard drinks  . Drug use: No  . Sexual activity: Not on file  Other Topics Concern  . Not on file  Social History Narrative  . Not on file   Social Determinants of Health   Financial Resource Strain: Not on file  Food Insecurity: Not on file  Transportation Needs: Not on file  Physical Activity: Not on file  Stress: Not on file  Social Connections: Not on file     Family History:  The patient's family history includes AAA (abdominal aortic aneurysm) in his brother; Aortic aneurysm in his mother; Stroke in his father.   Review of Systems:   Please see the history of present illness.     General:  No chills, fever, night sweats or weight changes.  Cardiovascular:  No chest pain, edema, orthopnea, palpitations, paroxysmal nocturnal dyspnea. Positive for dyspnea on exertion (at baseline).  Dermatological: No rash, lesions/masses Respiratory: No cough, dyspnea Urologic: No hematuria, dysuria Abdominal:   No nausea, vomiting, diarrhea, bright red blood per rectum, melena, or hematemesis Neurologic:  No visual changes, wkns, changes in mental status. All other systems reviewed and are otherwise negative except as noted above.   Physical Exam:    VS:  BP 132/84   Pulse (!) 56   Ht 5\' 11"  (1.803 m)   Wt 229 lb (103.9 kg)   SpO2 96%   BMI 31.94 kg/m    General: Well developed, well nourished,male appearing in no acute distress. Head:  Normocephalic, atraumatic. Neck: No carotid bruits. JVD not elevated.  Lungs: Respirations regular and unlabored, without wheezes or rales.  Heart: Regular rhythm, bradycardiac rate. No S3 or S4.  No murmur, no rubs, or gallops appreciated. Abdomen: Appears non-distended. No obvious abdominal masses. Msk:  Strength and tone appear normal for age. No obvious joint deformities or effusions. Extremities: No clubbing or cyanosis. No pitting edema.  Distal pedal pulses are 2+ bilaterally. Neuro: Alert and oriented X 3. Moves all extremities spontaneously. No focal deficits noted. Psych:  Responds to questions appropriately with a normal affect. Skin: No rashes or lesions noted  Wt Readings from Last 3 Encounters:  06/18/20 229 lb (103.9 kg)  06/16/19 234 lb (  106.1 kg)  12/16/18 230 lb (104.3 kg)     Studies/Labs Reviewed:   EKG:  EKG is ordered today. The ekg ordered today demonstrates sinus bradycardia, HR 56 with RBBB. No acute ST changes when compared to prior tracings.   Recent Labs: No results found for requested labs within last 8760 hours.   Lipid Panel    Component Value Date/Time   CHOL 161 12/29/2011 0840   TRIG 269 (H) 12/29/2011 0840   HDL 28 (L) 12/29/2011 0840   CHOLHDL 5.8 12/29/2011 0840   VLDL 54 (H) 12/29/2011 0840   LDLCALC 79 12/29/2011 0840    Additional studies/ records that were reviewed today include:   Echocardiogram: 05/2016 Study Conclusions   - Left ventricle: The cavity size was normal. Wall thickness was  increased in a pattern of mild LVH. Systolic function was normal.  The estimated ejection fraction was in the range of 55% to 60%.  Probable hypokinesis of the basalinferior myocardium. Left  ventricular diastolic function parameters were normal.  - Aortic valve: Mildly calcified annulus. Trileaflet.  - Mitral valve: There was trivial regurgitation.  - Right atrium: Central venous pressure (est): 3 mm Hg.  - Tricuspid valve: There was  trivial regurgitation.  - Pulmonary arteries: Systolic pressure could not be accurately  estimated.  - Pericardium, extracardiac: There was no pericardial effusion.   Impressions:   - Mild LVH with LVEF 55-60% and probable basal inferior  hypokinesis. Grossly normal diastolic function. Trivial mitral  regurgitation. Mildly sclerotic aortic annulus. Trivial tricuspid  regurgitation.   NST: 05/2016  Blood pressure demonstrated a normal response to exercise.  There was no ST segment deviation noted during stress. Normal chronotropic response to exercise.  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 There is no active ischemia.  This is a low risk study.  The left ventricular ejection fraction is normal (55-65%).  [facility] treadmill score of 9, consistent with low risk for major cardiac events.   Assessment:    1. Coronary artery disease involving native coronary artery of native heart without angina pectoris   2. Sinus bradycardia   3. Hyperlipidemia LDL goal <70   4. Tobacco use      Plan:   In order of problems listed above:  1. CAD - He is s/p NSTEMI with DES to [facility] in 08/2011 and most recent ischemic evaluation was a low-risk NST in 05/2016. He has baseline dyspnea on exertion in the setting of continued tobacco use but denies any acute changes in this or associated chest pain. - Will continue current medication regimen with ASA 81 mg daily and Crestor 40 mg daily.    2. Bradycardia - His baseline heart rate is typically in the 50's to 60's and he denies any associated lightheadedness, dizziness or presyncope with this. Continue to avoid AV nodal blocking agents.  3. HLD - This has been followed by his PCP and he reports he was recently just switched from Pravastatin to Crestor given his elevated numbers. Will request a copy of most recent labs from his PCP. He remains on Crestor 40mg  daily and Fenofibrate 160mg  daily.   4. Tobacco  Use - He continues to smoke 1 ppd. Cessation advised.    Medication Adjustments/Labs and Tests Ordered: Current medicines are reviewed at length with the patient today.  Concerns regarding medicines are outlined above.  Medication changes, Labs and Tests ordered today are listed in the Patient Instructions below. Patient Instructions  Medication Instructions:   Your physician  recommends that you continue on your current medications as directed. Please refer to the Current Medication list given to you today.  *If you need a refill on your cardiac medications before your next appointment, please call your pharmacy*  Lab Work: NONE ordered at this time of appointment   If you have labs (blood work) drawn today and your tests are completely normal, you will receive your results only by: Marland Kitchen MyChart Message (if you have MyChart) OR . A paper copy in the mail If you have any lab test that is abnormal or we need to change your treatment, we will call you to review the results.  Testing/Procedures: NONE ordered at this time of appointment    Follow-Up: At [facility], you and your health needs are our priority.  As part of our continuing mission to provide you with exceptional heart care, we have created designated Provider Care Teams.  These Care Teams include your primary Cardiologist (physician) and Advanced Practice Providers (APPs -  Physician Assistants and Nurse Practitioners) who all work together to provide you with the care you need, when you need it.    Your next appointment:   1 year(s)  The format for your next appointment:   In Person  Provider:   Bernerd Pho, PA-C or New MD   Other Instructions      Signed, Erma Heritage, PA-C  06/18/2020 4:42 PM    [place] [address] [place], [place] [address] Phone: [phone] Fax: [phone]

## 2020-07-04 DIAGNOSIS — C679 Malignant neoplasm of bladder, unspecified: Secondary | ICD-10-CM | POA: Diagnosis not present

## 2020-07-04 DIAGNOSIS — F17200 Nicotine dependence, unspecified, uncomplicated: Secondary | ICD-10-CM | POA: Diagnosis not present

## 2020-07-04 DIAGNOSIS — I2510 Atherosclerotic heart disease of native coronary artery without angina pectoris: Secondary | ICD-10-CM | POA: Diagnosis not present

## 2020-07-04 DIAGNOSIS — E8881 Metabolic syndrome: Secondary | ICD-10-CM | POA: Diagnosis not present

## 2020-07-04 DIAGNOSIS — E785 Hyperlipidemia, unspecified: Secondary | ICD-10-CM | POA: Diagnosis not present

## 2020-07-04 DIAGNOSIS — E119 Type 2 diabetes mellitus without complications: Secondary | ICD-10-CM | POA: Diagnosis not present

## 2020-07-04 DIAGNOSIS — E669 Obesity, unspecified: Secondary | ICD-10-CM | POA: Diagnosis not present

## 2020-07-08 DIAGNOSIS — F17200 Nicotine dependence, unspecified, uncomplicated: Secondary | ICD-10-CM | POA: Diagnosis not present

## 2020-07-08 DIAGNOSIS — E8881 Metabolic syndrome: Secondary | ICD-10-CM | POA: Diagnosis not present

## 2020-07-08 DIAGNOSIS — C679 Malignant neoplasm of bladder, unspecified: Secondary | ICD-10-CM | POA: Diagnosis not present

## 2020-07-08 DIAGNOSIS — E119 Type 2 diabetes mellitus without complications: Secondary | ICD-10-CM | POA: Diagnosis not present

## 2020-07-08 DIAGNOSIS — E785 Hyperlipidemia, unspecified: Secondary | ICD-10-CM | POA: Diagnosis not present

## 2020-07-08 DIAGNOSIS — Z1211 Encounter for screening for malignant neoplasm of colon: Secondary | ICD-10-CM | POA: Diagnosis not present

## 2020-07-08 DIAGNOSIS — I2510 Atherosclerotic heart disease of native coronary artery without angina pectoris: Secondary | ICD-10-CM | POA: Diagnosis not present

## 2020-07-08 DIAGNOSIS — E669 Obesity, unspecified: Secondary | ICD-10-CM | POA: Diagnosis not present

## 2020-07-11 DIAGNOSIS — Z1211 Encounter for screening for malignant neoplasm of colon: Secondary | ICD-10-CM | POA: Diagnosis not present

## 2020-07-11 DIAGNOSIS — C674 Malignant neoplasm of posterior wall of bladder: Secondary | ICD-10-CM | POA: Diagnosis not present

## 2021-01-07 DIAGNOSIS — E785 Hyperlipidemia, unspecified: Secondary | ICD-10-CM | POA: Diagnosis not present

## 2021-01-07 DIAGNOSIS — E119 Type 2 diabetes mellitus without complications: Secondary | ICD-10-CM | POA: Diagnosis not present

## 2021-01-07 DIAGNOSIS — C679 Malignant neoplasm of bladder, unspecified: Secondary | ICD-10-CM | POA: Diagnosis not present

## 2021-01-09 DIAGNOSIS — C674 Malignant neoplasm of posterior wall of bladder: Secondary | ICD-10-CM | POA: Diagnosis not present

## 2021-01-14 DIAGNOSIS — E8881 Metabolic syndrome: Secondary | ICD-10-CM | POA: Diagnosis not present

## 2021-01-14 DIAGNOSIS — Z800 Family history of malignant neoplasm of digestive organs: Secondary | ICD-10-CM | POA: Diagnosis not present

## 2021-01-14 DIAGNOSIS — E119 Type 2 diabetes mellitus without complications: Secondary | ICD-10-CM | POA: Diagnosis not present

## 2021-01-14 DIAGNOSIS — I2510 Atherosclerotic heart disease of native coronary artery without angina pectoris: Secondary | ICD-10-CM | POA: Diagnosis not present

## 2021-01-14 DIAGNOSIS — B356 Tinea cruris: Secondary | ICD-10-CM | POA: Diagnosis not present

## 2021-01-14 DIAGNOSIS — C679 Malignant neoplasm of bladder, unspecified: Secondary | ICD-10-CM | POA: Diagnosis not present

## 2021-01-14 DIAGNOSIS — E785 Hyperlipidemia, unspecified: Secondary | ICD-10-CM | POA: Diagnosis not present

## 2021-01-14 DIAGNOSIS — E669 Obesity, unspecified: Secondary | ICD-10-CM | POA: Diagnosis not present

## 2021-01-14 DIAGNOSIS — F17200 Nicotine dependence, unspecified, uncomplicated: Secondary | ICD-10-CM | POA: Diagnosis not present

## 2021-02-06 DIAGNOSIS — L820 Inflamed seborrheic keratosis: Secondary | ICD-10-CM | POA: Diagnosis not present

## 2021-07-10 DIAGNOSIS — C674 Malignant neoplasm of posterior wall of bladder: Secondary | ICD-10-CM | POA: Diagnosis not present

## 2021-07-31 DIAGNOSIS — R972 Elevated prostate specific antigen [PSA]: Secondary | ICD-10-CM | POA: Diagnosis not present

## 2021-07-31 DIAGNOSIS — E119 Type 2 diabetes mellitus without complications: Secondary | ICD-10-CM | POA: Diagnosis not present

## 2021-07-31 DIAGNOSIS — E785 Hyperlipidemia, unspecified: Secondary | ICD-10-CM | POA: Diagnosis not present

## 2021-07-31 DIAGNOSIS — Z87891 Personal history of nicotine dependence: Secondary | ICD-10-CM | POA: Diagnosis not present

## 2021-08-11 DIAGNOSIS — H5203 Hypermetropia, bilateral: Secondary | ICD-10-CM | POA: Diagnosis not present

## 2021-09-03 DIAGNOSIS — R972 Elevated prostate specific antigen [PSA]: Secondary | ICD-10-CM | POA: Diagnosis not present

## 2021-09-05 DIAGNOSIS — N529 Male erectile dysfunction, unspecified: Secondary | ICD-10-CM | POA: Diagnosis not present

## 2021-09-05 DIAGNOSIS — Z7984 Long term (current) use of oral hypoglycemic drugs: Secondary | ICD-10-CM | POA: Diagnosis not present

## 2021-09-05 DIAGNOSIS — C679 Malignant neoplasm of bladder, unspecified: Secondary | ICD-10-CM | POA: Diagnosis not present

## 2021-09-05 DIAGNOSIS — R972 Elevated prostate specific antigen [PSA]: Secondary | ICD-10-CM | POA: Diagnosis not present

## 2021-09-05 DIAGNOSIS — E1165 Type 2 diabetes mellitus with hyperglycemia: Secondary | ICD-10-CM | POA: Diagnosis not present

## 2021-09-05 DIAGNOSIS — E785 Hyperlipidemia, unspecified: Secondary | ICD-10-CM | POA: Diagnosis not present

## 2021-09-05 DIAGNOSIS — I2510 Atherosclerotic heart disease of native coronary artery without angina pectoris: Secondary | ICD-10-CM | POA: Diagnosis not present

## 2021-09-05 DIAGNOSIS — Z79899 Other long term (current) drug therapy: Secondary | ICD-10-CM | POA: Diagnosis not present

## 2021-09-05 DIAGNOSIS — Z91014 Allergy to mammalian meats: Secondary | ICD-10-CM | POA: Diagnosis not present

## 2021-09-18 DIAGNOSIS — R972 Elevated prostate specific antigen [PSA]: Secondary | ICD-10-CM | POA: Diagnosis not present

## 2021-09-18 DIAGNOSIS — C674 Malignant neoplasm of posterior wall of bladder: Secondary | ICD-10-CM | POA: Diagnosis not present

## 2021-12-24 ENCOUNTER — Ambulatory Visit: Payer: Medicare HMO | Attending: Internal Medicine | Admitting: Internal Medicine

## 2021-12-24 ENCOUNTER — Encounter: Payer: Self-pay | Admitting: Internal Medicine

## 2021-12-24 VITALS — BP 128/70 | HR 57 | Ht 71.0 in | Wt 215.8 lb

## 2021-12-24 DIAGNOSIS — J449 Chronic obstructive pulmonary disease, unspecified: Secondary | ICD-10-CM | POA: Diagnosis not present

## 2021-12-24 DIAGNOSIS — E7800 Pure hypercholesterolemia, unspecified: Secondary | ICD-10-CM | POA: Diagnosis not present

## 2021-12-24 DIAGNOSIS — E785 Hyperlipidemia, unspecified: Secondary | ICD-10-CM | POA: Diagnosis not present

## 2021-12-24 DIAGNOSIS — I2510 Atherosclerotic heart disease of native coronary artery without angina pectoris: Secondary | ICD-10-CM

## 2021-12-24 MED ORDER — ICOSAPENT ETHYL 1 G PO CAPS: 2.0000 g | ORAL_CAPSULE | Freq: Two times a day (BID) | ORAL | 3 refills | Status: AC

## 2021-12-24 NOTE — Patient Instructions (Addendum)
Medication Instructions:  Your physician has recommended you make the following change in your medication:  -Start Vascepa 2g capsules twice daily   Labwork: In 4 months: -Lipid Panel  Testing/Procedures: None  Follow-Up: Follow up with Dr. Dellia Cloud in 1 year.   Any Other Special Instructions Will Be Listed Below (If Applicable).     If you need a refill on your cardiac medications before your next appointment, please call your pharmacy.

## 2021-12-24 NOTE — Progress Notes (Signed)
Cardiology Office Note  Date: 12/24/2021   ID: Sean Gomez, DOB 01-Oct-1950, MRN [id number]  PCP:  [name], MD (Inactive)  Cardiologist:  None Electrophysiologist:  None   Reason for Office Visit: No chief complaint on file.   History of Present Illness: Patient is a 71 year old M known to have CAD manifested by NSTEMI in 2013 s/p RCA PCI with low risk stress test in 2018 and normal LVEF, HLD, tobacco abuse presented to cardiology clinic for follow-up visit of CAD. No interval ER visits or hospitalizations. Patient denied any rest or exertional chest discomfort, tightness, heaviness or pressure, DOE, palpitations, light-headedness, syncope, claudication and LE swelling. Compliant with medications and no side-effects. No bleeding complications. Smokes 1 pack cigarettes per day does not have any plan to quit smoking. He said elevated triglycerides runs in his family.  Takes also statin 40 mg nightly in the morning time. He does not like to take pills and hence takes everything in the morning.  He does not exercise however he performs strenuous physical activity, working on the land for hours.  Past Medical History:  Diagnosis Date   Allergic reaction to alpha-gal    Arteriosclerotic cardiovascular disease (ASCVD) 08/2011   NSTEMI 7/13: LHC with mRCA occluded tx with Xience DES to mRCA; minimal disease in CFX/LAD; EF of 45% with inferior hypokinesis; low blood pressure   Bradycardia    Cancer ([place])    bladder   Heart murmur    Hyperlipidemia    Myocardial infarct (HCC)    Tobacco abuse    60 pack years; 1.5 pack per day    Past Surgical History:  Procedure Laterality Date   APPENDECTOMY     CORONARY STENT PLACEMENT     LEFT HEART CATHETERIZATION WITH CORONARY ANGIOGRAM N/A 09/11/2011   Procedure: LEFT HEART CATHETERIZATION WITH CORONARY ANGIOGRAM;  Surgeon: Sherren Mocha, MD;  Location: [facility] CATH LAB;  Service: Cardiovascular;  Laterality: N/A;   PERCUTANEOUS  CORONARY STENT INTERVENTION (PCI-S) Right 09/11/2011   Procedure: PERCUTANEOUS CORONARY STENT INTERVENTION (PCI-S);  Surgeon: Sherren Mocha, MD;  Location: [facility] CATH LAB;  Service: Cardiovascular;  Laterality: Right;    Current Outpatient Medications  Medication Sig Dispense Refill   Ascorbic Acid (VITAMIN C) 500 MG CAPS Take 2,000 mg by mouth daily.     aspirin 81 MG tablet Take 81 mg by mouth daily.     diphenhydrAMINE (BENADRYL) 25 MG tablet Take 25 mg by mouth once as needed for allergies.     EPINEPHrine 0.3 mg/0.3 mL IJ SOAJ injection When you have an allergic reaction go ahead and give yourself 1 shot and then come to the emergency department 2 Device 0   icosapent Ethyl (VASCEPA) 1 g capsule Take 2 capsules (2 g total) by mouth 2 (two) times daily. 360 capsule 3   metFORMIN (GLUCOPHAGE) 500 MG tablet      Multiple Vitamin (MULTIVITAMIN WITH MINERALS) TABS tablet Take 1 tablet by mouth daily.     nitroGLYCERIN (NITROSTAT) 0.4 MG SL tablet PLACE 1 TABLET UNDER TONGUE EVERY 5 MINUTES FOR 3 DOSES AS NEEDED FOR CHEST PAIN 25 tablet 3   rosuvastatin (CRESTOR) 40 MG tablet      sildenafil (VIAGRA) 100 MG tablet Take 1/2 tablet (50 mg) 30 minutes prior to sexual activity 10 tablet 1   No current facility-administered medications for this visit.   Allergies:  Other, Atorvastatin, Sulfa antibiotics, and Penicillins   Social History: The patient  reports that he has been smoking  cigarettes. He started smoking about 56 years ago. He has a 40.00 pack-year smoking history. He has never used smokeless tobacco. He reports that he does not drink alcohol and does not use drugs.   Family History: The patient's family history includes AAA (abdominal aortic aneurysm) in his brother; Aortic aneurysm in his mother; Stroke in his father.   ROS:  Please see the history of present illness. Otherwise, complete review of systems is positive for none.  All other systems are reviewed and negative.   Physical  Exam: VS:  BP 128/70   Pulse (!) 57   Ht '5\' 11"'$  (1.803 m)   Wt 215 lb 12.8 oz (97.9 kg)   SpO2 98%   BMI 30.10 kg/m , BMI Body mass index is 30.1 kg/m.  Wt Readings from Last 3 Encounters:  12/24/21 215 lb 12.8 oz (97.9 kg)  06/18/20 229 lb (103.9 kg)  06/16/19 234 lb (106.1 kg)    General: Patient appears comfortable at rest. HEENT: Conjunctiva and lids normal, oropharynx clear with moist mucosa. Neck: Supple, no elevated JVP or carotid bruits, no thyromegaly. Lungs: Clear to auscultation, nonlabored breathing at rest. Cardiac: Regular rate and rhythm, no S3 or significant systolic murmur, no pericardial rub. Abdomen: Soft, nontender, no hepatomegaly, bowel sounds present, no guarding or rebound. Extremities: No pitting edema, distal pulses 2+. Skin: Warm and dry. Musculoskeletal: No kyphosis. Neuropsychiatric: Alert and oriented x3, affect grossly appropriate.  ECG:  An ECG dated 12/24/2021 was personally reviewed today and demonstrated:  Sinus bradycardia, incomplete right bundle branch block  Recent Labwork: No results found for requested labs within last 365 days.     Component Value Date/Time   CHOL 161 12/29/2011 0840   TRIG 269 (H) 12/29/2011 0840   HDL 28 (L) 12/29/2011 0840   CHOLHDL 5.8 12/29/2011 0840   VLDL 54 (H) 12/29/2011 0840   LDLCALC 79 12/29/2011 0840   2022, PCP Total cholesterol 136 HDL 31 LDL 54 Triglycerides 330   Other Studies Reviewed Today: Echo in 2018 - Left ventricle: The cavity size was normal. Wall thickness was    increased in a pattern of mild LVH. Systolic function was normal.    The estimated ejection fraction was in the range of 55% to 60%.    Probable hypokinesis of the basalinferior myocardium. Left    ventricular diastolic function parameters were normal.  - Aortic valve: Mildly calcified annulus. Trileaflet.  - Mitral valve: There was trivial regurgitation.  - Right atrium: Central venous pressure (est): 3 mm Hg.  -  Tricuspid valve: There was trivial regurgitation.  - Pulmonary arteries: Systolic pressure could not be accurately    estimated.  - Pericardium, extracardiac: There was no pericardial effusion.   Assessment and Plan: Patient is a 71 year old M known to have CAD manifested by NSTEMI in 2013 s/p RCA PCI with low risk stress test in 2018 and normal LVEF, HLD, tobacco abuse presented to cardiology clinic for follow-up visit of CAD.  #CAD manifested by NSTEMI in 2013 s/p RCA PCI with low risk stress test in 2018 and normal LVEF, currently angina free Plan -Continue aspirin 81 mg once daily -Can you rosuvastatin 40 mg nightly. Instructed patient to take the Crestor medication only at the bedtime and not in the morning.  Patient voiced understanding. -No indication of BB or ACEI -SL NTG 0.4 mg as needed. Patient has Viagra listed on the chart. Instructed patient not to take SL NTG 0.4 mg within 24 hours of him taking  Viagra. He was instructed to go to the ER in that situation. He voiced understanding.  -Recommended 150 minutes of moderate intensity exercise per week RSM 5 minutes of high intensity exercise per week, per guidelines. patient voiced understanding.  #Hypercholesteremia, currently at goal LDL 54 in 2022 (goal less than 70) #Hypertriglyceridemia, currently not on goal Plan -Continue rosuvastatin 40 mg nightly.Instructed patient to take the Crestor medication only at the bedtime and not in the morning. Patient voiced understanding. -Patient has elevated triglycerides, in 300s. Fibrates are not contraindicated in patients on statins due to high risk of muscle aches. He will benefit from icosapent Ethyl, 2 g twice daily. Obtain Lipid panel in 4 months.  #Nicotine abuse Plan -Patient not interested to quit smoking at this time. I explained to him the risk of nicotine abuse, including stroke, heart attacks, poor circulation in his legs. Patient voiced understanding. He still does not want to  quit.  I have spent a total of 35 minutes with patient reviewing chart, EKGs, labs and examining patient as well as establishing an assessment and plan that was discussed with the patient.  > 50% of time was spent in direct patient care.     Medication Adjustments/Labs and Tests Ordered: Current medicines are reviewed at length with the patient today.  Concerns regarding medicines are outlined above.   Tests Ordered: Orders Placed This Encounter  Procedures   Lipid panel   EKG 12-Lead    Medication Changes: Meds ordered this encounter  Medications   icosapent Ethyl (VASCEPA) 1 g capsule    Sig: Take 2 capsules (2 g total) by mouth 2 (two) times daily.    Dispense:  360 capsule    Refill:  3    Disposition:  Follow up  one year  Signed, Pavle Wiler Fidel Levy, MD, 12/24/2021 10:00 AM    [facility] at [facility] [address] [place], [place] [address]

## 2022-01-01 DIAGNOSIS — R972 Elevated prostate specific antigen [PSA]: Secondary | ICD-10-CM | POA: Diagnosis not present

## 2022-01-08 DIAGNOSIS — R300 Dysuria: Secondary | ICD-10-CM | POA: Diagnosis not present

## 2022-01-08 DIAGNOSIS — C674 Malignant neoplasm of posterior wall of bladder: Secondary | ICD-10-CM | POA: Diagnosis not present

## 2022-01-08 DIAGNOSIS — R972 Elevated prostate specific antigen [PSA]: Secondary | ICD-10-CM | POA: Diagnosis not present

## 2022-01-08 DIAGNOSIS — N400 Enlarged prostate without lower urinary tract symptoms: Secondary | ICD-10-CM | POA: Diagnosis not present

## 2022-01-08 DIAGNOSIS — N401 Enlarged prostate with lower urinary tract symptoms: Secondary | ICD-10-CM | POA: Diagnosis not present

## 2022-01-08 DIAGNOSIS — R82998 Other abnormal findings in urine: Secondary | ICD-10-CM | POA: Diagnosis not present

## 2022-02-05 DIAGNOSIS — R300 Dysuria: Secondary | ICD-10-CM | POA: Diagnosis not present

## 2022-02-05 DIAGNOSIS — C674 Malignant neoplasm of posterior wall of bladder: Secondary | ICD-10-CM | POA: Diagnosis not present

## 2022-03-10 DIAGNOSIS — C679 Malignant neoplasm of bladder, unspecified: Secondary | ICD-10-CM | POA: Diagnosis not present

## 2022-03-10 DIAGNOSIS — E1165 Type 2 diabetes mellitus with hyperglycemia: Secondary | ICD-10-CM | POA: Diagnosis not present

## 2022-03-10 DIAGNOSIS — I2510 Atherosclerotic heart disease of native coronary artery without angina pectoris: Secondary | ICD-10-CM | POA: Diagnosis not present

## 2022-03-10 DIAGNOSIS — E785 Hyperlipidemia, unspecified: Secondary | ICD-10-CM | POA: Diagnosis not present

## 2022-03-10 DIAGNOSIS — N529 Male erectile dysfunction, unspecified: Secondary | ICD-10-CM | POA: Diagnosis not present

## 2022-03-10 DIAGNOSIS — Z6831 Body mass index (BMI) 31.0-31.9, adult: Secondary | ICD-10-CM | POA: Diagnosis not present

## 2022-03-10 DIAGNOSIS — F439 Reaction to severe stress, unspecified: Secondary | ICD-10-CM | POA: Diagnosis not present

## 2022-03-10 DIAGNOSIS — E668 Other obesity: Secondary | ICD-10-CM | POA: Diagnosis not present

## 2022-03-10 DIAGNOSIS — Z0000 Encounter for general adult medical examination without abnormal findings: Secondary | ICD-10-CM | POA: Diagnosis not present

## 2022-03-17 DIAGNOSIS — C674 Malignant neoplasm of posterior wall of bladder: Secondary | ICD-10-CM | POA: Diagnosis not present

## 2022-04-29 ENCOUNTER — Emergency Department (HOSPITAL_COMMUNITY): Payer: Medicare HMO

## 2022-04-29 ENCOUNTER — Other Ambulatory Visit: Payer: Self-pay

## 2022-04-29 ENCOUNTER — Encounter (HOSPITAL_COMMUNITY): Payer: Self-pay

## 2022-04-29 ENCOUNTER — Emergency Department (HOSPITAL_COMMUNITY)
Admission: EM | Admit: 2022-04-29 | Discharge: 2022-04-29 | Discharge status: Against Medical Advice | Payer: Medicare HMO | Attending: Emergency Medicine | Admitting: Emergency Medicine

## 2022-04-29 DIAGNOSIS — K30 Functional dyspepsia: Secondary | ICD-10-CM | POA: Insufficient documentation

## 2022-04-29 DIAGNOSIS — R0789 Other chest pain: Secondary | ICD-10-CM | POA: Diagnosis not present

## 2022-04-29 DIAGNOSIS — R079 Chest pain, unspecified: Secondary | ICD-10-CM | POA: Diagnosis not present

## 2022-04-29 DIAGNOSIS — Z5321 Procedure and treatment not carried out due to patient leaving prior to being seen by health care provider: Secondary | ICD-10-CM | POA: Diagnosis not present

## 2022-04-29 DIAGNOSIS — J449 Chronic obstructive pulmonary disease, unspecified: Secondary | ICD-10-CM | POA: Diagnosis not present

## 2022-04-29 DIAGNOSIS — R911 Solitary pulmonary nodule: Secondary | ICD-10-CM | POA: Diagnosis not present

## 2022-04-29 LAB — CBC WITH DIFFERENTIAL/PLATELET
Abs Immature Granulocytes: 0.01 10*3/uL (ref 0.00–0.07)
Basophils Absolute: 0.1 10*3/uL (ref 0.0–0.1)
Basophils Relative: 1 %
Eosinophils Absolute: 0.2 10*3/uL (ref 0.0–0.5)
Eosinophils Relative: 3 %
HCT: 38.5 % — ABNORMAL LOW (ref 39.0–52.0)
Hemoglobin: 13.5 g/dL (ref 13.0–17.0)
Immature Granulocytes: 0 %
Lymphocytes Relative: 33 %
Lymphs Abs: 2 10*3/uL (ref 0.7–4.0)
MCH: 32.2 pg (ref 26.0–34.0)
MCHC: 35.1 g/dL (ref 30.0–36.0)
MCV: 91.9 fL (ref 80.0–100.0)
Monocytes Absolute: 0.6 10*3/uL (ref 0.1–1.0)
Monocytes Relative: 10 %
Neutro Abs: 3.3 10*3/uL (ref 1.7–7.7)
Neutrophils Relative %: 53 %
Platelets: 194 10*3/uL (ref 150–400)
RBC: 4.19 MIL/uL — ABNORMAL LOW (ref 4.22–5.81)
RDW: 12.2 % (ref 11.5–15.5)
WBC: 6.2 10*3/uL (ref 4.0–10.5)
nRBC: 0 % (ref 0.0–0.2)

## 2022-04-29 LAB — BASIC METABOLIC PANEL
Anion gap: 7 (ref 5–15)
BUN: 11 mg/dL (ref 8–23)
CO2: 27 mmol/L (ref 22–32)
Calcium: 9.3 mg/dL (ref 8.9–10.3)
Chloride: 103 mmol/L (ref 98–111)
Creatinine, Ser: 0.81 mg/dL (ref 0.61–1.24)
GFR, Estimated: >60 mL/min (ref >60)
Glucose, Bld: 147 mg/dL — ABNORMAL HIGH (ref 70–99)
Potassium: 4 mmol/L (ref 3.5–5.1)
Sodium: 137 mmol/L (ref 135–145)

## 2022-04-29 LAB — TROPONIN I (HIGH SENSITIVITY): Troponin I (High Sensitivity): 4 ng/L (ref <18)

## 2022-04-29 NOTE — ED Provider Triage Note (Signed)
Emergency Medicine Provider Triage Evaluation Note  Sean Gomez , a 72 y.o. male  was evaluated in triage.  Pt complains of chest pain.  He states that he has had intermittent chest pain over the past 3 days.  He describes it feeling like indigestion or reflux.  He states that the last time he had a heart attack he had similar symptoms.  He states that usually his indigestion does not last this long as this.  He states that also yesterday he began feeling mildly lightheaded and took a nitro with no relief of symptoms.  He denies feeling short of breath or having palpitations.  He denies any nausea or diaphoresis.  Review of Systems  Positive: See above Negative:   Physical Exam  BP 131/74   Pulse (!) 58   Temp 97.9 F (36.6 C) (Oral)   Resp 17   Ht '5\' 11"'$  (1.803 m)   Wt 99.8 kg   SpO2 95%   BMI 30.68 kg/m  Gen:   Awake, no distress   Resp:  Normal effort  MSK:   Moves extremities without difficulty  Other:    Medical Decision Making  Medically screening exam initiated at 1:45 PM.  Appropriate orders placed.  Sean Gomez was informed that the remainder of the evaluation will be completed by another provider, this initial triage assessment does not replace that evaluation, and the importance of remaining in the ED until their evaluation is complete.     Sean Hillier, PA-C 04/29/22 1349

## 2022-04-29 NOTE — ED Notes (Signed)
Notified by registration that patient left, stating he did not want to wait any longer.

## 2022-04-29 NOTE — ED Triage Notes (Signed)
Pt presents to ED from home C/O chest pain and indigestion X 3 days. Reports last time he had a heart attack these were his only symptoms.

## 2022-08-13 DIAGNOSIS — H5203 Hypermetropia, bilateral: Secondary | ICD-10-CM | POA: Diagnosis not present

## 2022-09-16 DIAGNOSIS — C674 Malignant neoplasm of posterior wall of bladder: Secondary | ICD-10-CM | POA: Diagnosis not present

## 2022-09-30 DIAGNOSIS — H524 Presbyopia: Secondary | ICD-10-CM | POA: Diagnosis not present

## 2022-10-19 ENCOUNTER — Other Ambulatory Visit: Payer: Self-pay | Admitting: Family Medicine

## 2022-10-19 DIAGNOSIS — E1165 Type 2 diabetes mellitus with hyperglycemia: Secondary | ICD-10-CM | POA: Diagnosis not present

## 2022-10-19 DIAGNOSIS — F17200 Nicotine dependence, unspecified, uncomplicated: Secondary | ICD-10-CM

## 2022-10-19 DIAGNOSIS — E785 Hyperlipidemia, unspecified: Secondary | ICD-10-CM | POA: Diagnosis not present

## 2022-10-19 DIAGNOSIS — F17210 Nicotine dependence, cigarettes, uncomplicated: Secondary | ICD-10-CM | POA: Diagnosis not present

## 2022-10-19 DIAGNOSIS — R809 Proteinuria, unspecified: Secondary | ICD-10-CM | POA: Diagnosis not present

## 2022-10-19 DIAGNOSIS — Z6830 Body mass index (BMI) 30.0-30.9, adult: Secondary | ICD-10-CM | POA: Diagnosis not present

## 2022-10-19 DIAGNOSIS — E1136 Type 2 diabetes mellitus with diabetic cataract: Secondary | ICD-10-CM | POA: Diagnosis not present

## 2022-10-29 ENCOUNTER — Encounter: Payer: Self-pay | Admitting: Family Medicine

## 2022-11-05 ENCOUNTER — Ambulatory Visit
Admission: RE | Admit: 2022-11-05 | Discharge: 2022-11-05 | Disposition: A | Discharge status: Home / Self Care | Payer: Medicare HMO | Source: Ambulatory Visit | Attending: Family Medicine | Admitting: Family Medicine

## 2022-11-05 DIAGNOSIS — F17200 Nicotine dependence, unspecified, uncomplicated: Secondary | ICD-10-CM

## 2022-11-05 DIAGNOSIS — F17210 Nicotine dependence, cigarettes, uncomplicated: Secondary | ICD-10-CM | POA: Diagnosis not present

## 2022-12-16 ENCOUNTER — Ambulatory Visit: Payer: Medicare HMO | Attending: Internal Medicine | Admitting: Internal Medicine

## 2022-12-16 ENCOUNTER — Encounter: Payer: Self-pay | Admitting: Internal Medicine

## 2022-12-16 ENCOUNTER — Encounter: Payer: Self-pay | Admitting: Family Medicine

## 2022-12-16 VITALS — BP 120/68 | HR 72 | Ht 71.0 in | Wt 211.0 lb

## 2022-12-16 DIAGNOSIS — I2510 Atherosclerotic heart disease of native coronary artery without angina pectoris: Secondary | ICD-10-CM

## 2022-12-16 NOTE — Progress Notes (Signed)
Cardiology Office Note  Date: 12/16/2022   ID: Sean Gomez, DOB 07/06/1950, MRN [id number]  PCP:  [facility] [place]  Cardiologist:  Marjo Bicker, MD Electrophysiologist:  None    History of Present Illness: Patient is a 72 year old M known to have CAD manifested by NSTEMI in 2013 s/p RCA PCI with low risk stress test in 2018 and normal LVEF, HLD, tobacco abuse presented to the cardiology clinic for follow-up visit of CAD.   Patient stopped taking all his medications except aspirin 81 mg, vitamin C and multivitamin tablet.  He stopped taking rosuvastatin after he researched that rosuvastatin can increase HbA1c levels and exacerbate diabetes mellitus. His HbA1c level increased from 7 to 9 and hence he stopped it.  He also stopped metformin and wants to see how his sugars will be.  He also switched his diet into vegan at this time and he noticed sugars are controlled compared to prior.  Current smoker, does not want to quit at all.  He also said he does not care if he gets a heart attack in the future but he does not want to quit smoking.  Past Medical History:  Diagnosis Date   Allergic reaction to alpha-gal    Arteriosclerotic cardiovascular disease (ASCVD) 08/2011   NSTEMI 7/13: LHC with mRCA occluded tx with Xience DES to mRCA; minimal disease in CFX/LAD; EF of 45% with inferior hypokinesis; low blood pressure   Bradycardia    Cancer (HCC)    bladder   Heart murmur    Hyperlipidemia    Myocardial infarct (HCC)    Tobacco abuse    60 pack years; 1.5 pack per day    Past Surgical History:  Procedure Laterality Date   APPENDECTOMY     CORONARY STENT PLACEMENT     LEFT HEART CATHETERIZATION WITH CORONARY ANGIOGRAM N/A 09/11/2011   Procedure: LEFT HEART CATHETERIZATION WITH CORONARY ANGIOGRAM;  Surgeon: Tonny Bollman, MD;  Location: [facility] CATH LAB;  Service: Cardiovascular;  Laterality: N/A;   PERCUTANEOUS CORONARY STENT INTERVENTION (PCI-S)  Right 09/11/2011   Procedure: PERCUTANEOUS CORONARY STENT INTERVENTION (PCI-S);  Surgeon: Tonny Bollman, MD;  Location: [facility] CATH LAB;  Service: Cardiovascular;  Laterality: Right;    Current Outpatient Medications  Medication Sig Dispense Refill   Ascorbic Acid (VITAMIN C) 500 MG CAPS Take 2,000 mg by mouth daily.     aspirin 81 MG tablet Take 81 mg by mouth daily.     diphenhydrAMINE (BENADRYL) 25 MG tablet Take 25 mg by mouth once as needed for allergies.     EPINEPHrine 0.3 mg/0.3 mL IJ SOAJ injection When you have an allergic reaction go ahead and give yourself 1 shot and then come to the emergency department 2 Device 0   icosapent Ethyl (VASCEPA) 1 g capsule Take 2 capsules (2 g total) by mouth 2 (two) times daily. 360 capsule 3   Multiple Vitamin (MULTIVITAMIN WITH MINERALS) TABS tablet Take 1 tablet by mouth daily.     nitroGLYCERIN (NITROSTAT) 0.4 MG SL tablet PLACE 1 TABLET UNDER TONGUE EVERY 5 MINUTES FOR 3 DOSES AS NEEDED FOR CHEST PAIN 25 tablet 3   sildenafil (VIAGRA) 100 MG tablet Take 1/2 tablet (50 mg) 30 minutes prior to sexual activity 10 tablet 1   metFORMIN (GLUCOPHAGE) 500 MG tablet  (Patient not taking: Reported on 12/16/2022)     No current facility-administered medications for this visit.   Allergies:  Other, Atorvastatin, Sulfa antibiotics, and Penicillins   Social History:  The patient  reports that he has been smoking cigarettes. He started smoking about 57 years ago. He has a 51.3 pack-year smoking history. He has never used smokeless tobacco. He reports that he does not drink alcohol and does not use drugs.   Family History: The patient's family history includes AAA (abdominal aortic aneurysm) in his brother; Aortic aneurysm in his mother; Stroke in his father.   ROS:  Please see the history of present illness. Otherwise, complete review of systems is positive for none.  All other systems are reviewed and negative.   Physical Exam: VS:  Ht 5\' 11"  (1.803 m)   Wt  211 lb (95.7 kg)   BMI 29.43 kg/m , BMI Body mass index is 29.43 kg/m.  Wt Readings from Last 3 Encounters:  12/16/22 211 lb (95.7 kg)  04/29/22 220 lb (99.8 kg)  12/24/21 215 lb 12.8 oz (97.9 kg)    General: Patient appears comfortable at rest. HEENT: Conjunctiva and lids normal, oropharynx clear with moist mucosa. Neck: Supple, no elevated JVP or carotid bruits, no thyromegaly. Lungs: Clear to auscultation, nonlabored breathing at rest. Cardiac: Regular rate and rhythm, no S3 or significant systolic murmur, no pericardial rub. Abdomen: Soft, nontender, no hepatomegaly, bowel sounds present, no guarding or rebound. Extremities: No pitting edema, distal pulses 2+. Skin: Warm and dry. Musculoskeletal: No kyphosis. Neuropsychiatric: Alert and oriented x3, affect grossly appropriate.  ECG:  An ECG dated 12/24/2021 was personally reviewed today and demonstrated:  Sinus bradycardia, incomplete right bundle branch block  Recent Labwork: 04/29/2022: BUN 11; Creatinine, Ser 0.81; Hemoglobin 13.5; Platelets 194; Potassium 4.0; Sodium 137     Component Value Date/Time   CHOL 161 12/29/2011 0840   TRIG 269 (H) 12/29/2011 0840   HDL 28 (L) 12/29/2011 0840   CHOLHDL 5.8 12/29/2011 0840   VLDL 54 (H) 12/29/2011 0840   LDLCALC 79 12/29/2011 0840   2022, PCP Total cholesterol 136 HDL 31 LDL 54 Triglycerides 330   Other Studies Reviewed Today: Echo in 2018 - Left ventricle: The cavity size was normal. Wall thickness was    increased in a pattern of mild LVH. Systolic function was normal.    The estimated ejection fraction was in the range of 55% to 60%.    Probable hypokinesis of the basalinferior myocardium. Left    ventricular diastolic function parameters were normal.  - Aortic valve: Mildly calcified annulus. Trileaflet.  - Mitral valve: There was trivial regurgitation.  - Right atrium: Central venous pressure (est): 3 mm Hg.  - Tricuspid valve: There was trivial regurgitation.   - Pulmonary arteries: Systolic pressure could not be accurately    estimated.  - Pericardium, extracardiac: There was no pericardial effusion.   Assessment and Plan: Patient is a 72 year old M known to have CAD manifested by NSTEMI in 2013 s/p RCA PCI with low risk stress test in 2018 and normal LVEF, HLD, tobacco abuse presented to cardiology clinic for follow-up visit of CAD.  #CAD manifested by NSTEMI in 2013 s/p RCA PCI with low risk stress test in 2018 and normal LVEF, currently angina free -Continue aspirin 81 mg once daily -Stop taking statin, will think about injectables. -ER precautions for chest pain.  #Hypercholesteremia, currently at goal LDL 54 in 2022 (goal less than 70) #Hypertriglyceridemia, currently not on goal -Previously on pravastatin, atorvastatin did not tolerate.  On rosuvastatin that increased his HbA1C levels and hence self discontinued it.  Discussed about starting Repatha or inclisiran, patient will think about it and  call the clinic when he decides.  Not on Vascepa either. -Will obtain lipid panel report from the PCP.  I also discussed about diabetes control for the triglycerides to normalize.  #Nicotine abuse -Current smoker, does not want to quit.  He reported that he does not care if he ends up with a heart attack in the future but he does not want to quit smoking at all.  I spent total duration of 30 minutes reviewing notes, labs, face-to-face discussion about his medical condition, smoking, answering questions, documenting the findings in the note.   Disposition:  Follow up  one year  Signed, Anysha Frappier Verne Spurr, MD, 12/16/2022 10:01 AM    [facility] at [facility] [address] [place], [place] [address]

## 2022-12-16 NOTE — Patient Instructions (Addendum)
Medication Instructions:  Your physician recommends that you continue on your current medications as directed. Please refer to the Current Medication list given to you today.  *If you need a refill on your cardiac medications before your next appointment, please call your pharmacy*   Lab Work: None If you have labs (blood work) drawn today and your tests are completely normal, you will receive your results only by: MyChart Message (if you have MyChart) OR A paper copy in the mail If you have any lab test that is abnormal or we need to change your treatment, we will call you to review the results.   Testing/Procedures: None   Follow-Up: At [facility], you and your health needs are our priority.  As part of our continuing mission to provide you with exceptional heart care, we have created designated Provider Care Teams.  These Care Teams include your primary Cardiologist (physician) and Advanced Practice Providers (APPs -  Physician Assistants and Nurse Practitioners) who all work together to provide you with the care you need, when you need it.  We recommend signing up for the patient portal called "MyChart".  Sign up information is provided on this After Visit Summary.  MyChart is used to connect with patients for Virtual Visits (Telemedicine).  Patients are able to view lab/test results, encounter notes, upcoming appointments, etc.  Non-urgent messages can be sent to your provider as well.   To learn more about what you can do with MyChart, go to [email or web address].    Your next appointment:   1 year(s)  Provider:   Vishnu Mallipeddi, MD    Other Instructions    

## 2023-04-01 DIAGNOSIS — E785 Hyperlipidemia, unspecified: Secondary | ICD-10-CM | POA: Diagnosis not present

## 2023-04-01 DIAGNOSIS — E1165 Type 2 diabetes mellitus with hyperglycemia: Secondary | ICD-10-CM | POA: Diagnosis not present

## 2023-04-01 DIAGNOSIS — E1129 Type 2 diabetes mellitus with other diabetic kidney complication: Secondary | ICD-10-CM | POA: Diagnosis not present

## 2023-04-01 DIAGNOSIS — R809 Proteinuria, unspecified: Secondary | ICD-10-CM | POA: Diagnosis not present

## 2023-05-04 DIAGNOSIS — J310 Chronic rhinitis: Secondary | ICD-10-CM | POA: Diagnosis not present

## 2023-05-04 DIAGNOSIS — Z0000 Encounter for general adult medical examination without abnormal findings: Secondary | ICD-10-CM | POA: Diagnosis not present

## 2023-05-04 DIAGNOSIS — E1165 Type 2 diabetes mellitus with hyperglycemia: Secondary | ICD-10-CM | POA: Diagnosis not present

## 2023-05-04 DIAGNOSIS — Z6828 Body mass index (BMI) 28.0-28.9, adult: Secondary | ICD-10-CM | POA: Diagnosis not present

## 2023-05-04 DIAGNOSIS — G720 Drug-induced myopathy: Secondary | ICD-10-CM | POA: Diagnosis not present

## 2023-05-18 DIAGNOSIS — C674 Malignant neoplasm of posterior wall of bladder: Secondary | ICD-10-CM | POA: Diagnosis not present

## 2023-08-19 DIAGNOSIS — E119 Type 2 diabetes mellitus without complications: Secondary | ICD-10-CM | POA: Diagnosis not present

## 2023-11-03 DIAGNOSIS — E1165 Type 2 diabetes mellitus with hyperglycemia: Secondary | ICD-10-CM | POA: Diagnosis not present

## 2023-11-17 DIAGNOSIS — C674 Malignant neoplasm of posterior wall of bladder: Secondary | ICD-10-CM | POA: Diagnosis not present

## 2023-11-23 DIAGNOSIS — R070 Pain in throat: Secondary | ICD-10-CM | POA: Diagnosis not present

## 2024-03-02 NOTE — Progress Notes (Unsigned)
 " Cardiology Gomez Note:  .   Date:  03/03/2024  ID:  Sean Gomez, DOB 1951-02-10, MRN [id number] PCP: [facility] Providers Cardiologist:  Diannah SHAUNNA Maywood, MD {  History of Present Illness: .   Sean Gomez is a 74 y.o. male  with PMHx of CAD, HLD, tobacco use who reports to Sean Gomez for 1 year follow up.   Pertinent cardiac medical history:  CAD NSTEMI in 2013 s/p DES RCA Echo 05/2016: EF 55 to 60%, hypokinesis of basal inferior myocardium, trivial MV regurgitation, trivial TV regurgitation Low risk stress test 2016 & 2018.  Last seen in heartcare 12/2022 by Dr. Mallipeddi for follow-up. Reported self-discontinuation of all prescription medications except aspirin  81 mg, vitamin C , and a multivitamin. He stopped rosuvastatin and metformin due to concern for worsening glycemic control, noting HbA1c increased from ~7% to ~9%, and reports improved glucose readings since adopting a vegan diet. He remains an active smoker and declines cessation despite counseling.  Discussed considering Repatha or inclisiran and noted patient will follow-up with the clinic if he decides to proceed.  Continued on ASA 81 mg daily.  Today, reports SOB associated with dry cough in the morning that seems more related to congestion. He feel like he can't catch his breath. Although, if he lays on his side at night then this is less of a concern. He have SOB with steps. Also reports occasional LE edema, however he mainly sits at computer for 10 hours playing computer games. Denies any known history of COPD. He also admits to burning chest pain that is more consistent with indigestion. Denies any exertional chest pain. Denies palpitations, syncope, presyncope, dizziness, orthopnea, PND, significant weight changes, acute bleeding, or claudication.  Reports compliance with medications. He is not interested in any injectable medication for cholesterol. He  prefers to avoid any additional medications. Denies high sodium diet. He drinks nearly 2 liters of diet mountain dew. He is able to walk around the grocery store and do yard work without any concerns.He smoke 1-2 PPD. He has not desire to quit and is aware of associated risk. Denies alcohol/drug use.    ROS: 10 point review of system has been reviewed and considered negative except ones been listed in the HPI.   Studies Reviewed: SABRA   EKG Interpretation Date/Time:  Friday March 03 2024 10:56:19 EST Ventricular Rate:  89 PR Interval:  214 QRS Duration:  100 QT Interval:  358 QTC Calculation: 435 R Axis:   -83  Text Interpretation: Sinus rhythm with 1st degree A-V block Left axis deviation Incomplete right bundle branch block Left anterior fascicular block When compared with ECG of 16-Dec-2022 10:03, PR interval has increased Artifact Confirmed by Sheron Hallmark ([id number]) on 03/03/2024 11:02:19 AM   Cath 2013 PCI Data: Vessel - RCA/Segment - mid Percent Stenosis (pre)  100 TIMI-flow 1 Stent 3.5 x 28 mm drug-eluting Percent Stenosis (post) 0 TIMI-flow (post) 3   Final Conclusions:   1. 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 Minimal disease of the left main, LAD, and left circumflex 3. Mild to moderate segmental left ventricular systolic dysfunction   Recommendations: Dual antiplatelet therapy with aspirin  and effient  for 12 months minimum. Aggressive risk reduction to include tobacco cessation. Post myocardial infarction care in the CCU. If the patient has an uncomplicated course he could be considered for discharge in 6  hours.  Echo 05/2016  Study Conclusions  - Left ventricle: The cavity size was normal. Wall thickness was    increased in a pattern of mild LVH. Systolic function was normal.    The estimated ejection fraction was in the range of 55% to 60%.    Probable hypokinesis of  the basalinferior myocardium. Left    ventricular diastolic function parameters were normal.  - Aortic valve: Mildly calcified annulus. Trileaflet.  - Mitral valve: There was trivial regurgitation.  - Right atrium: Central venous pressure (est): 3 mm Hg.  - Tricuspid valve: There was trivial regurgitation.  - Pulmonary arteries: Systolic pressure could not be accurately    estimated.  - Pericardium, extracardiac: There was no pericardial effusion.   Impressions:   - Mild LVH with LVEF 55-60% and probable basal inferior    hypokinesis. Grossly normal diastolic function. Trivial mitral    regurgitation. Mildly sclerotic aortic annulus. Trivial tricuspid    regurgitation.   Exercise Myoview  05/2016 Narrative & Impression  Blood pressure demonstrated a normal response to exercise. There was no ST segment deviation noted during stress. Normal chronotropic response to exercise. 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 There is no active ischemia. This is a low risk study. The left ventricular ejection fraction is normal (55-65%). Duke treadmill score of 9, consistent with low risk for major cardiac events.    CV Studies: Cardiac studies reviewed are outlined and summarized above. Otherwise please see EMR for full report.   Physical Exam:   VS:  BP 128/60 (BP Location: Right Arm, Patient Position: Sitting, Cuff Size: Large)   Pulse 89   Ht 5' 11 (1.803 m)   Wt 231 lb (104.8 kg)   SpO2 98%   BMI 32.22 kg/m    Wt Readings from Last 3 Encounters:  03/03/24 231 lb (104.8 kg)  12/16/22 211 lb (95.7 kg)  04/29/22 220 lb (99.8 kg)    GEN: Well nourished, well developed in no acute distress while sitting in chair.  NECK: No JVD; No carotid bruits CARDIAC: RRR, no murmurs, rubs, gallops RESPIRATORY:  Clear to auscultation without rales, wheezing or rhonchi  ABDOMEN: Soft, non-tender, non-distended EXTREMITIES:  No edema; No deformity   ASSESSMENT AND PLAN:  .    Coronary artery disease involving native coronary artery of native heart without angina pectoris Hyperlipidemia LDL goal <70 EKG today: NSR without any ischemic changes.  Order ECHO.  Denies angina symptoms. No need for further ischemic evaluation at this time.  LDL 100, TG 473, HLD 29, total cholesterol 209 in 10/2022 via PCP lab results. Will follow up with new PCP in 04/2023. Prefers to get labs done with PCP.  Continue ASA 81 mg and Vascepa  2g BID.  Previously did not tolerate pravastatin  or atorvastatin . Previously self discontinued rosuvastatin due to increased A1c. Patient is not interested in Repatha/Inclisiran/any other lipid medication. He understand the risk of untreated cholesterol and prefers to avoid medications.   SOB  LE Edema  Reports SOB in the am associated with cough that seem more consistent with congestion. Also note LE edema after prolonged sitting at computer. Order ECHO. He has a significant smoking history and no known COPD. Offered PFT and pulmonary referral but patient declined. Scheduled to meet with new PCP in 04/2023 and would refer to discuss with PCP.    Tobacco use Currently smokes 1-2 PPD after 50 + years  Dicussed cessation but no desire for cessation    Dispo: Follow up in  1 year unless abnormal ECHO results then will bring back fot sooner appointment.   Signed, Lorette CINDERELLA Kapur, PA-C  "

## 2024-03-03 ENCOUNTER — Encounter: Payer: Self-pay | Admitting: Physician Assistant

## 2024-03-03 ENCOUNTER — Ambulatory Visit: Attending: Physician Assistant | Admitting: Physician Assistant

## 2024-03-03 VITALS — BP 128/60 | HR 89 | Ht 71.0 in | Wt 231.0 lb

## 2024-03-03 DIAGNOSIS — Z720 Tobacco use: Secondary | ICD-10-CM | POA: Diagnosis not present

## 2024-03-03 DIAGNOSIS — E785 Hyperlipidemia, unspecified: Secondary | ICD-10-CM | POA: Diagnosis not present

## 2024-03-03 DIAGNOSIS — R0602 Shortness of breath: Secondary | ICD-10-CM

## 2024-03-03 DIAGNOSIS — I2510 Atherosclerotic heart disease of native coronary artery without angina pectoris: Secondary | ICD-10-CM

## 2024-03-03 NOTE — Patient Instructions (Signed)
 Medication Instructions:  Your physician recommends that you continue on your current medications as directed. Please refer to the Current Medication list given to you today.  *If you need a refill on your cardiac medications before your next appointment, please call your pharmacy*  Lab Work: None today If you have labs (blood work) drawn today and your tests are completely normal, you will receive your results only by: MyChart Message (if you have MyChart) OR A paper copy in the mail If you have any lab test that is abnormal or we need to change your treatment, we will call you to review the results.  Testing/Procedures: Your physician has requested that you have an echocardiogram. Echocardiography is a painless test that uses sound waves to create images of your heart. It provides your doctor with information about the size and shape of your heart and how well your hearts chambers and valves are working. This procedure takes approximately one hour. There are no restrictions for this procedure. Please do NOT wear cologne, perfume, aftershave, or lotions (deodorant is allowed). Please arrive 15 minutes prior to your appointment time.  Please note: We ask at that you not bring children with you during ultrasound (echo/ vascular) testing. Due to room size and safety concerns, children are not allowed in the ultrasound rooms during exams. Our front office staff cannot provide observation of children in our lobby area while testing is being conducted. An adult accompanying a patient to their appointment will only be allowed in the ultrasound room at the discretion of the ultrasound technician under special circumstances. We apologize for any inconvenience.   Follow-Up: At [facility], you and your health needs are our priority.  As part of our continuing mission to provide you with exceptional heart care, our providers are all part of one team.  This team includes your primary  Cardiologist (physician) and Advanced Practice Providers or APPs (Physician Assistants and Nurse Practitioners) who all work together to provide you with the care you need, when you need it.  Your next appointment:   1 year(s)  Provider:   You may see Vishnu P Mallipeddi, MD or one of the following Advanced Practice Providers on your designated Care Team:   Brittany Strader, PA-C  [place], [place] Olivia [place], [place]    We recommend signing up for the patient portal called MyChart.  Sign up information is provided on this After Visit Summary.  MyChart is used to connect with patients for Virtual Visits (Telemedicine).  Patients are able to view lab/test results, encounter notes, upcoming appointments, etc.  Non-urgent messages can be sent to your provider as well.   To learn more about what you can do with MyChart, go to [email or web address].   Other Instructions None    Thank you for choosing [facility] !

## 2024-03-08 ENCOUNTER — Ambulatory Visit (HOSPITAL_COMMUNITY)

## 2024-03-23 ENCOUNTER — Ambulatory Visit (HOSPITAL_COMMUNITY)
Admission: RE | Admit: 2024-03-23 | Discharge: 2024-03-23 | Disposition: A | Discharge status: Home / Self Care | Source: Ambulatory Visit | Attending: Physician Assistant | Admitting: Physician Assistant

## 2024-03-23 DIAGNOSIS — R0602 Shortness of breath: Secondary | ICD-10-CM | POA: Insufficient documentation

## 2024-03-23 LAB — ECHOCARDIOGRAM COMPLETE
AR max vel: 2.29 cm2
AV Area VTI: 2.35 cm2
AV Area mean vel: 2.19 cm2
AV Mean grad: 4.6 mmHg
AV Peak grad: 9.5 mmHg
Ao pk vel: 1.54 m/s
Area-P 1/2: 2.45 cm2
S' Lateral: 3.1 cm

## 2024-03-23 NOTE — Progress Notes (Signed)
*  PRELIMINARY RESULTS* Echocardiogram 2D Echocardiogram has been performed.  Sean Gomez 03/23/2024, 10:19 AM

## 2024-03-27 ENCOUNTER — Ambulatory Visit: Payer: Self-pay | Admitting: Physician Assistant

## 2024-04-10 ENCOUNTER — Ambulatory Visit
